# Patient Record
Sex: Male | Born: 1970 | Race: White | Hispanic: No | Marital: Married | Smoking: Former smoker
Health system: Southern US, Community
[De-identification: ages and names within clinical notes are randomized; demographics above are authoritative.]

## PROBLEM LIST (undated history)

## (undated) DIAGNOSIS — L400 Psoriasis vulgaris: Secondary | ICD-10-CM

## (undated) DIAGNOSIS — R002 Palpitations: Secondary | ICD-10-CM

## (undated) DIAGNOSIS — F411 Generalized anxiety disorder: Secondary | ICD-10-CM

## (undated) DIAGNOSIS — M1990 Unspecified osteoarthritis, unspecified site: Secondary | ICD-10-CM

## (undated) DIAGNOSIS — K219 Gastro-esophageal reflux disease without esophagitis: Secondary | ICD-10-CM

## (undated) DIAGNOSIS — K5190 Ulcerative colitis, unspecified, without complications: Secondary | ICD-10-CM

## (undated) DIAGNOSIS — I472 Ventricular tachycardia: Secondary | ICD-10-CM

## (undated) HISTORY — DX: Palpitations: R00.2

## (undated) HISTORY — DX: Psoriasis vulgaris: L40.0

## (undated) HISTORY — DX: Ulcerative colitis, unspecified, without complications: K51.90

## (undated) HISTORY — DX: Unspecified osteoarthritis, unspecified site: M19.90

## (undated) HISTORY — DX: Gastro-esophageal reflux disease without esophagitis: K21.9

## (undated) HISTORY — DX: Ventricular tachycardia: I47.2

## (undated) HISTORY — DX: Generalized anxiety disorder: F41.1

---

## 2011-03-26 ENCOUNTER — Ambulatory Visit (HOSPITAL_COMMUNITY)
Admission: RE | Admit: 2011-03-26 | Discharge: 2011-03-26 | Disposition: A | Discharge status: Home / Self Care | Payer: BC Managed Care – PPO | Source: Ambulatory Visit | Attending: Gastroenterology | Admitting: Gastroenterology

## 2011-03-27 ENCOUNTER — Ambulatory Visit (HOSPITAL_COMMUNITY)
Admission: RE | Admit: 2011-03-27 | Discharge: 2011-03-27 | Disposition: A | Discharge status: Home / Self Care | Payer: BC Managed Care – PPO | Source: Ambulatory Visit | Attending: Gastroenterology | Admitting: Gastroenterology

## 2011-03-27 ENCOUNTER — Other Ambulatory Visit: Payer: Self-pay | Admitting: Gastroenterology

## 2011-03-27 DIAGNOSIS — K2940 Chronic atrophic gastritis without bleeding: Secondary | ICD-10-CM | POA: Insufficient documentation

## 2011-03-27 DIAGNOSIS — K3189 Other diseases of stomach and duodenum: Secondary | ICD-10-CM | POA: Insufficient documentation

## 2011-03-27 DIAGNOSIS — K208 Other esophagitis without bleeding: Secondary | ICD-10-CM | POA: Insufficient documentation

## 2011-03-27 DIAGNOSIS — R1013 Epigastric pain: Secondary | ICD-10-CM | POA: Insufficient documentation

## 2012-05-01 ENCOUNTER — Inpatient Hospital Stay: Admit: 2012-05-01 | Payer: Self-pay | Admitting: Orthopedic Surgery

## 2012-05-01 ENCOUNTER — Encounter (HOSPITAL_COMMUNITY): Payer: Self-pay | Admitting: Anesthesiology

## 2012-05-01 ENCOUNTER — Emergency Department (HOSPITAL_COMMUNITY): Payer: BC Managed Care – PPO | Admitting: Anesthesiology

## 2012-05-01 ENCOUNTER — Observation Stay (HOSPITAL_COMMUNITY)
Admission: EM | Admit: 2012-05-01 | Discharge: 2012-05-04 | Disposition: A | Discharge status: Home / Self Care | Payer: BC Managed Care – PPO | Attending: Emergency Medicine | Admitting: Emergency Medicine

## 2012-05-01 ENCOUNTER — Encounter (HOSPITAL_COMMUNITY): Payer: Self-pay | Admitting: *Deleted

## 2012-05-01 ENCOUNTER — Encounter (HOSPITAL_COMMUNITY)
Admission: EM | Disposition: A | Discharge status: Home / Self Care | Payer: Self-pay | Source: Home / Self Care | Attending: Orthopedic Surgery

## 2012-05-01 DIAGNOSIS — S61209A Unspecified open wound of unspecified finger without damage to nail, initial encounter: Principal | ICD-10-CM | POA: Insufficient documentation

## 2012-05-01 DIAGNOSIS — Y939 Activity, unspecified: Secondary | ICD-10-CM | POA: Insufficient documentation

## 2012-05-01 DIAGNOSIS — S61409A Unspecified open wound of unspecified hand, initial encounter: Secondary | ICD-10-CM

## 2012-05-01 DIAGNOSIS — IMO0002 Reserved for concepts with insufficient information to code with codable children: Secondary | ICD-10-CM | POA: Insufficient documentation

## 2012-05-01 DIAGNOSIS — Y929 Unspecified place or not applicable: Secondary | ICD-10-CM | POA: Insufficient documentation

## 2012-05-01 DIAGNOSIS — S65509A Unspecified injury of blood vessel of unspecified finger, initial encounter: Secondary | ICD-10-CM | POA: Insufficient documentation

## 2012-05-01 DIAGNOSIS — W278XXA Contact with other nonpowered hand tool, initial encounter: Secondary | ICD-10-CM | POA: Insufficient documentation

## 2012-05-01 DIAGNOSIS — S5420XA Injury of radial nerve at forearm level, unspecified arm, initial encounter: Secondary | ICD-10-CM | POA: Insufficient documentation

## 2012-05-01 HISTORY — PX: I & D EXTREMITY: SHX5045

## 2012-05-01 HISTORY — PX: NERVE AND TENDON REPAIR: SHX5693

## 2012-05-01 LAB — CBC WITH DIFFERENTIAL/PLATELET
Basophils Absolute: 0 10*3/uL (ref 0.0–0.1)
Basophils Relative: 0 % (ref 0–1)
Eosinophils Absolute: 0 10*3/uL (ref 0.0–0.7)
Hemoglobin: 16.5 g/dL (ref 13.0–17.0)
MCH: 29.8 pg (ref 26.0–34.0)
MCHC: 34.7 g/dL (ref 30.0–36.0)
Monocytes Relative: 2 % — ABNORMAL LOW (ref 3–12)
Neutro Abs: 12 10*3/uL — ABNORMAL HIGH (ref 1.7–7.7)
Neutrophils Relative %: 86 % — ABNORMAL HIGH (ref 43–77)
Platelets: 326 10*3/uL (ref 150–400)
RDW: 13.4 % (ref 11.5–15.5)

## 2012-05-01 LAB — POCT I-STAT, CHEM 8
Chloride: 106 mEq/L (ref 96–112)
Glucose, Bld: 104 mg/dL — ABNORMAL HIGH (ref 70–99)
HCT: 51 % (ref 39.0–52.0)
Hemoglobin: 17.3 g/dL — ABNORMAL HIGH (ref 13.0–17.0)
Potassium: 4.1 mEq/L (ref 3.5–5.1)
Sodium: 143 mEq/L (ref 135–145)

## 2012-05-01 SURGERY — IRRIGATION AND DEBRIDEMENT EXTREMITY
Anesthesia: General | Site: Hand | Laterality: Left | Wound class: Dirty or Infected

## 2012-05-01 MED ORDER — LIDOCAINE HCL 4 % MT SOLN
OROMUCOSAL | Status: DC | PRN
  Administered 2012-05-01: 4 mL via TOPICAL

## 2012-05-01 MED ORDER — SODIUM CHLORIDE 0.9 % IR SOLN
Status: DC | PRN
  Administered 2012-05-01: 6000 mL

## 2012-05-01 MED ORDER — LACTATED RINGERS IV SOLN
INTRAVENOUS | Status: DC | PRN
  Administered 2012-05-01 (×3): via INTRAVENOUS

## 2012-05-01 MED ORDER — MIDAZOLAM HCL 5 MG/5ML IJ SOLN
INTRAMUSCULAR | Status: DC | PRN
  Administered 2012-05-01: 2 mg via INTRAVENOUS

## 2012-05-01 MED ORDER — LIDOCAINE HCL (CARDIAC) 20 MG/ML IV SOLN
INTRAVENOUS | Status: DC | PRN
  Administered 2012-05-01: 100 mg via INTRAVENOUS

## 2012-05-01 MED ORDER — ROCURONIUM BROMIDE 100 MG/10ML IV SOLN
INTRAVENOUS | Status: DC | PRN
  Administered 2012-05-01 (×5): 10 mg via INTRAVENOUS
  Administered 2012-05-01: 50 mg via INTRAVENOUS
  Administered 2012-05-01: 10 mg via INTRAVENOUS

## 2012-05-01 MED ORDER — DEXTROSE 5 % IV SOLN
INTRAVENOUS | Status: DC | PRN
  Administered 2012-05-01: 20:00:00 via INTRAVENOUS

## 2012-05-01 MED ORDER — PROPOFOL 10 MG/ML IV BOLUS
INTRAVENOUS | Status: DC | PRN
  Administered 2012-05-01: 200 mg via INTRAVENOUS

## 2012-05-01 MED ORDER — CEFAZOLIN SODIUM-DEXTROSE 2-3 GM-% IV SOLR
INTRAVENOUS | Status: DC | PRN
  Administered 2012-05-01: 2 g via INTRAVENOUS

## 2012-05-01 MED ORDER — NEOSTIGMINE METHYLSULFATE 1 MG/ML IJ SOLN
INTRAMUSCULAR | Status: DC | PRN
  Administered 2012-05-01: 4 mg via INTRAVENOUS

## 2012-05-01 MED ORDER — HEPARIN SODIUM (PORCINE) 1000 UNIT/ML IJ SOLN
INTRAMUSCULAR | Status: DC | PRN
  Administered 2012-05-01: 1000 [IU] via INTRAVENOUS

## 2012-05-01 MED ORDER — SUCCINYLCHOLINE CHLORIDE 20 MG/ML IJ SOLN
INTRAMUSCULAR | Status: DC | PRN
  Administered 2012-05-01: 120 mg via INTRAVENOUS

## 2012-05-01 MED ORDER — GLYCOPYRROLATE 0.2 MG/ML IJ SOLN
INTRAMUSCULAR | Status: DC | PRN
  Administered 2012-05-01: .6 mg via INTRAVENOUS

## 2012-05-01 MED ORDER — ONDANSETRON HCL 4 MG/2ML IJ SOLN
INTRAMUSCULAR | Status: DC | PRN
  Administered 2012-05-01: 4 mg via INTRAVENOUS

## 2012-05-01 MED ORDER — DEXAMETHASONE SODIUM PHOSPHATE 4 MG/ML IJ SOLN
INTRAMUSCULAR | Status: DC | PRN
  Administered 2012-05-01: 8 mg via INTRAVENOUS

## 2012-05-01 MED ORDER — FENTANYL CITRATE 0.05 MG/ML IJ SOLN
INTRAMUSCULAR | Status: DC | PRN
  Administered 2012-05-01 (×2): 50 ug via INTRAVENOUS
  Administered 2012-05-01 (×2): 100 ug via INTRAVENOUS
  Administered 2012-05-01 (×2): 50 ug via INTRAVENOUS
  Administered 2012-05-01: 100 ug via INTRAVENOUS

## 2012-05-01 SURGICAL SUPPLY — 66 items
BANDAGE CONFORM 2  STR LF (GAUZE/BANDAGES/DRESSINGS) IMPLANT
BANDAGE ELASTIC 4 VELCRO ST LF (GAUZE/BANDAGES/DRESSINGS) ×4 IMPLANT
BANDAGE GAUZE ELAST BULKY 4 IN (GAUZE/BANDAGES/DRESSINGS) ×2 IMPLANT
CLOTH BEACON ORANGE TIMEOUT ST (SAFETY) ×2 IMPLANT
CONT SPEC 4OZ CLIKSEAL STRL BL (MISCELLANEOUS) ×2 IMPLANT
CORDS BIPOLAR (ELECTRODE) ×2 IMPLANT
CUFF TOURNIQUET SINGLE 18IN (TOURNIQUET CUFF) IMPLANT
CUFF TOURNIQUET SINGLE 24IN (TOURNIQUET CUFF) ×2 IMPLANT
CUFF TOURNIQUET SINGLE 34IN LL (TOURNIQUET CUFF) IMPLANT
CUFF TOURNIQUET SINGLE 44IN (TOURNIQUET CUFF) IMPLANT
DRSG ADAPTIC 3X8 NADH LF (GAUZE/BANDAGES/DRESSINGS) IMPLANT
DRSG EMULSION OIL 3X3 NADH (GAUZE/BANDAGES/DRESSINGS) ×2 IMPLANT
ELECT REM PT RETURN 9FT ADLT (ELECTROSURGICAL)
ELECTRODE REM PT RTRN 9FT ADLT (ELECTROSURGICAL) IMPLANT
GAUZE SPONGE 4X4 16PLY XRAY LF (GAUZE/BANDAGES/DRESSINGS) ×4 IMPLANT
GAUZE XEROFORM 1X8 LF (GAUZE/BANDAGES/DRESSINGS) IMPLANT
GAUZE XEROFORM 5X9 LF (GAUZE/BANDAGES/DRESSINGS) ×2 IMPLANT
GLOVE BIO SURGEON STRL SZ7.5 (GLOVE) ×2 IMPLANT
GLOVE BIOGEL M STRL SZ7.5 (GLOVE) IMPLANT
GLOVE BIOGEL PI IND STRL 7.5 (GLOVE) ×2 IMPLANT
GLOVE BIOGEL PI INDICATOR 7.5 (GLOVE) ×2
GLOVE SS BIOGEL STRL SZ 8 (GLOVE) ×2 IMPLANT
GLOVE SS BIOGEL STRL SZ 8.5 (GLOVE) ×1 IMPLANT
GLOVE SS BIOGEL STRL SZ 9 (GLOVE) ×3 IMPLANT
GLOVE SUPERSENSE BIOGEL SZ 8 (GLOVE) ×2
GLOVE SUPERSENSE BIOGEL SZ 8.5 (GLOVE) ×1
GLOVE SUPERSENSE BIOGEL SZ 9 (GLOVE) ×3
GOWN PREVENTION PLUS XLARGE (GOWN DISPOSABLE) ×2 IMPLANT
GOWN STRL NON-REIN LRG LVL3 (GOWN DISPOSABLE) ×2 IMPLANT
GOWN STRL REIN XL XLG (GOWN DISPOSABLE) ×2 IMPLANT
GUIDE NERVE NEURAGEN 1.5MM (Tissue) ×2 IMPLANT
GUIDE NRV 3X7RSRB NEURAGEN 2M (Tissue) ×1 IMPLANT
HANDPIECE INTERPULSE COAX TIP (DISPOSABLE)
IV NS IRRIG 3000ML ARTHROMATIC (IV SOLUTION) ×6 IMPLANT
KIT BASIN OR (CUSTOM PROCEDURE TRAY) ×2 IMPLANT
KIT ROOM TURNOVER OR (KITS) ×2 IMPLANT
MANIFOLD NEPTUNE II (INSTRUMENTS) ×2 IMPLANT
NEEDLE HYPO 25GX1X1/2 BEV (NEEDLE) IMPLANT
NERVE GUIDE NEURAGEN 2MM (Tissue) ×1 IMPLANT
NS IRRIG 1000ML POUR BTL (IV SOLUTION) ×6 IMPLANT
PACK ORTHO EXTREMITY (CUSTOM PROCEDURE TRAY) ×2 IMPLANT
PAD ARMBOARD 7.5X6 YLW CONV (MISCELLANEOUS) ×4 IMPLANT
PAD CAST 4YDX4 CTTN HI CHSV (CAST SUPPLIES) IMPLANT
PADDING CAST ABS 4INX4YD NS (CAST SUPPLIES) ×1
PADDING CAST ABS COTTON 4X4 ST (CAST SUPPLIES) ×1 IMPLANT
PADDING CAST COTTON 4X4 STRL (CAST SUPPLIES)
SET HNDPC FAN SPRY TIP SCT (DISPOSABLE) IMPLANT
SPEAR EYE SURG WECK-CEL (MISCELLANEOUS) ×2 IMPLANT
SPLINT FIBERGLASS 4X30 (CAST SUPPLIES) ×2 IMPLANT
SPONGE GAUZE 4X4 12PLY (GAUZE/BANDAGES/DRESSINGS) ×2 IMPLANT
SPONGE LAP 18X18 X RAY DECT (DISPOSABLE) ×2 IMPLANT
SPONGE LAP 4X18 X RAY DECT (DISPOSABLE) ×2 IMPLANT
SUT ETHILON 10 0 BV75 4 (SUTURE) ×4 IMPLANT
SUT ETHILON 8 0 BV130 4 (SUTURE) ×6 IMPLANT
SUT ETHILON 9 0 BV130 4 (SUTURE) ×2 IMPLANT
SUT FIBERWIRE 4-0 18 DIAM BLUE (SUTURE) ×8
SUT PROLENE 4 0 PS 2 18 (SUTURE) ×18 IMPLANT
SUTURE FIBERWR 4-0 18 DIA BLUE (SUTURE) ×4 IMPLANT
SYR CONTROL 10ML LL (SYRINGE) IMPLANT
SYRINGE 10CC LL (SYRINGE) ×2 IMPLANT
TOWEL OR 17X24 6PK STRL BLUE (TOWEL DISPOSABLE) ×2 IMPLANT
TOWEL OR 17X26 10 PK STRL BLUE (TOWEL DISPOSABLE) ×2 IMPLANT
TUBE ANAEROBIC SPECIMEN COL (MISCELLANEOUS) IMPLANT
TUBE CONNECTING 12X1/4 (SUCTIONS) ×2 IMPLANT
WATER STERILE IRR 1000ML POUR (IV SOLUTION) IMPLANT
YANKAUER SUCT BULB TIP NO VENT (SUCTIONS) ×2 IMPLANT

## 2012-05-01 NOTE — Anesthesia Procedure Notes (Signed)
Procedure Name: Intubation Date/Time: 05/01/2012 7:33 PM Performed by: Emaan Gary S Pre-anesthesia Checklist: Patient identified, Timeout performed, Emergency Drugs available, Suction available and Patient being monitored Patient Re-evaluated:Patient Re-evaluated prior to inductionOxygen Delivery Method: Circle system utilized Preoxygenation: Pre-oxygenation with 100% oxygen Intubation Type: IV induction and Rapid sequence Ventilation: Mask ventilation without difficulty Grade View: Grade I Tube type: Oral Tube size: 7.5 mm Number of attempts: 1 Airway Equipment and Method: Stylet Placement Confirmation: ETT inserted through vocal cords under direct vision,  positive ETCO2 and breath sounds checked- equal and bilateral Secured at: 22 cm Tube secured with: Tape Dental Injury: Teeth and Oropharynx as per pre-operative assessment

## 2012-05-01 NOTE — ED Provider Notes (Signed)
Patient placed in CDU as a transfer from [facility]. Patient is here for left hand laceration and has received Keflex by mouth, Percocet for pain and tetanus booster IM at [facility] prior to transfer.   Patient states he was working with a skill saw when the plate dropped and he attempted to catch it with his hand. He states that while being evaluated at [facility] an x-ray was taken of his hand showing involvement of the bone on the left ring finger and the physician was concerned about severance of the flexor tendon. Plan per previous provider is to consult Dr. Amanda Pea for surgical repair.  Patient re-evaluated and is resting comfortably, VSS, with no new complaints or concerns at this time.  On exam: hemodynamically stable, NAD, heart w/ RRR, lungs CTAB, Chest & abd non-tender, no peripheral edema or calf tenderness, large laceration across all 4 fingers of the left hand, capillary refill less than 3 seconds, inability to move the pointer and ring fingers of the left hand.  Patient pain controled at this time.  BP 154/89  Pulse 90  Temp 98.2 F (36.8 C) (Oral)  Resp 20  SpO2 100%  Discussed with patient current lab and imaging results as well as their care plan, patient questions answered.  Patient is amenable to the plan.   6:13 PM Dr Amanda Pea has evaluated the patient and will be transferring to the OR for repair.    Dahlia Client Landan Fedie, PA-C 05/01/12 1813

## 2012-05-01 NOTE — ED Notes (Signed)
Pt in c/o laceration to left hand, lacerations to all four fingers. Pt was told to come here to meet hand surgeon in ED, pt from [facility].

## 2012-05-01 NOTE — Preoperative (Signed)
Beta Blockers   Reason not to administer Beta Blockers:Not Applicable 

## 2012-05-01 NOTE — ED Provider Notes (Signed)
MSE was initiated and I personally evaluated the patient and placed orders (if any) at  4:59 PM on May 01, 2012.  The patient appears stable so that the remainder of the MSE may be completed by another provider.  Pt received treatment at another ED.  He is here to meet the orthopedic hand surgeon, Dr Amanda Pea.  Pt is not in any pain now.  He was given a tetanus shot.  He has his records and xrays with him.  Celene Kras, MD 05/01/12 [phone]

## 2012-05-01 NOTE — Anesthesia Preprocedure Evaluation (Addendum)
Anesthesia Evaluation  Patient identified by MRN, date of birth, ID band Patient awake    Reviewed: Allergy & Precautions, H&P , NPO status , Patient's Chart, lab work & pertinent test results, reviewed documented beta blocker date and time   Airway Mallampati: II TM Distance: >3 FB Neck ROM: full    Dental  (+) Teeth Intact and Dental Advisory Given   Pulmonary neg pulmonary ROS,  breath sounds clear to auscultation        Cardiovascular negative cardio ROS  Rhythm:regular     Neuro/Psych negative neurological ROS  negative psych ROS   GI/Hepatic negative GI ROS, Neg liver ROS,   Endo/Other  negative endocrine ROS  Renal/GU negative Renal ROS  negative genitourinary   Musculoskeletal   Abdominal   Peds  Hematology negative hematology ROS (+)   Anesthesia Other Findings See surgeon's H&P   Reproductive/Obstetrics negative OB ROS                          Anesthesia Physical Anesthesia Plan  ASA: II and emergent  Anesthesia Plan: General   Post-op Pain Management:    Induction: Intravenous, Rapid sequence and Cricoid pressure planned  Airway Management Planned: Oral ETT  Additional Equipment:   Intra-op Plan:   Post-operative Plan: Extubation in OR  Informed Consent: I have reviewed the patients History and Physical, chart, labs and discussed the procedure including the risks, benefits and alternatives for the proposed anesthesia with the patient or authorized representative who has indicated his/her understanding and acceptance.   Dental Advisory Given and Dental advisory given  Plan Discussed with: CRNA and Surgeon  Anesthesia Plan Comments:        Anesthesia Quick Evaluation

## 2012-05-01 NOTE — H&P (Signed)
[name] is an 41 y.o. male.   Chief Complaint: Saw injury to the left hand  HPI: Patient presents for evaluation and surgical treatment after a saw injury to his left hand. He has index middle ring and small finger lacerations secondary to the saw. The left hand is the area involved. He was seen outside [facility] and referred him from [place]. I accepted the patient's care. He is been given a tetanus shot and Keflex by mouth. At the time of consultation he denies neck back chest or abdominal pain. He notes no locking popping or catching. He states he had the saw in the right hand and saw across his left hand in a reflex reaction as something was falling  He understands the nature of his predicament he is here today with his wife I've counseled him at length. The patient notes pain and the feeling of a searing nerve type jolt to his hand as soon as the accident occurred. He states this was a fairly new blade in terms of the saw.  History reviewed. No pertinent past medical history.  History reviewed. No pertinent past surgical history.  History reviewed. No pertinent family history. Social History:  does not have a smoking history on file. He does not have any smokeless tobacco history on file. His alcohol and drug histories not on file.  Allergies: No Known Allergies  No prescriptions prior to admission    Results for orders placed during the hospital encounter of 05/01/12 (from the past 48 hour(s))  CBC WITH DIFFERENTIAL     Status: Abnormal   Collection Time   05/01/12  6:10 PM      Component Value Range Comment   WBC 14.0 (*) 4.0 - 10.5 K/uL    RBC 5.54  4.22 - 5.81 MIL/uL    Hemoglobin 16.5  13.0 - 17.0 g/dL    HCT 16.1  09.6 - 04.5 %    MCV 85.7  78.0 - 100.0 fL    MCH 29.8  26.0 - 34.0 pg    MCHC 34.7  30.0 - 36.0 g/dL    RDW 40.9  81.1 - 91.4 %    Platelets 326  150 - 400 K/uL    Neutrophils Relative 86 (*) 43 - 77 %    Neutro Abs 12.0 (*) 1.7 - 7.7 K/uL    Lymphocytes Relative 12  12 - 46 %    Lymphs Abs 1.7  0.7 - 4.0 K/uL    Monocytes Relative 2 (*) 3 - 12 %    Monocytes Absolute 0.3  0.1 - 1.0 K/uL    Eosinophils Relative 0  0 - 5 %    Eosinophils Absolute 0.0  0.0 - 0.7 K/uL    Basophils Relative 0  0 - 1 %    Basophils Absolute 0.0  0.0 - 0.1 K/uL   POCT I-STAT, CHEM 8     Status: Abnormal   Collection Time   05/01/12  6:36 PM      Component Value Range Comment   Sodium 143  135 - 145 mEq/L    Potassium 4.1  3.5 - 5.1 mEq/L    Chloride 106  96 - 112 mEq/L    BUN 16  6 - 23 mg/dL    Creatinine, Ser 7.82  0.50 - 1.35 mg/dL    Glucose, Bld 956 (*) 70 - 99 mg/dL    Calcium, Ion 2.13 (*) 1.12 - 1.23 mmol/L    TCO2 24  0 - 100 mmol/L    Hemoglobin 17.3 (*) 13.0 - 17.0 g/dL    HCT 40.9  81.1 - 91.4 %    No results found.  Review of Systems  Constitutional: Negative.   HENT: Negative.   Eyes: Negative.   Respiratory: Negative.   Cardiovascular: Negative.   Gastrointestinal: Negative.   Skin:       History of psoriasis with outbreak in his legs  Neurological: Negative.   Endo/Heme/Allergies: Negative.   Psychiatric/Behavioral: Negative.     Blood pressure 154/89, pulse 90, temperature 98.2 F (36.8 C), temperature source Oral, resp. rate 20, SpO2 100.00%. Physical Exam  Patient has lacerations secondary to table saw with mutilating type injury to the volar aspect of the index middle ring and small fingers. The index middle and ring had tendon involvement the middle finger has some degree of a cascade effect. The small finger has intact FDS. He does not have good refill to the index finger. The small finger ring finger middle finger and index finger difficult to evaluate in terms of sensory capabilities do to a previous block placed outside of our hospital system.  The patient has difficulty moving his fingers he is very apprehensive. The injuries unfortunately a very jagged and somewhat mutilating. I've casting in regards to  this.  He has psoriasis outbreak in his legs. HEENT is within normal limits. Abdomen is nontender protuberant and nondistended neck and back are nontender chest is equal expansion and he is non-O2 dependent. Assessment/Plan We have recommended surgery immediately. Will plan for irrigation and debridement and repair reconstruction is necessary. I discussed him risk and benefits including loss of digit pain stiffness adhesions complex regional pain syndrome and other risks are main his injury. We're going to do our best to try to salvage his hand but certainly he has a major problem and will have some degree of disability permanently in my estimation. In the best case scenario we have to give him a hand that is functional alive and albeit with some decreased sensation. I discussed him that this is a very unfortunate and life changing predicament. I cannot proceed into the future in terms of his viability and he understands this.  Marland Kitchen.We are planning surgery for your upper extremity. The risk and benefits of surgery include risk of bleeding infection anesthesia damage to normal structures and failure of the surgery to accomplish its intended goals of relieving symptoms and restoring function with this in mind we'll going to proceed. I have specifically discussed with the patient the pre-and postoperative regime and the does and don'ts and risk and benefits in great detail. Risk and benefits of surgery also include risk of dystrophy chronic nerve pain failure of the healing process to go onto completion and other inherent risks of surgery The relavent the pathophysiology of the disease/injury process, as well as the alternatives for treatment and postoperative course of action has been discussed in great detail with the patient who desires to proceed.  We will do everything in our power to help you (the patient) restore function to the upper extremity. Is a pleasure to see this patient today.   Karen Chafe 05/01/2012, 11:59 PM

## 2012-05-02 LAB — CBC WITH DIFFERENTIAL/PLATELET
Lymphocytes Relative: 8 % — ABNORMAL LOW (ref 12–46)
Lymphs Abs: 0.7 10*3/uL (ref 0.7–4.0)
MCV: 86.7 fL (ref 78.0–100.0)
Neutrophils Relative %: 92 % — ABNORMAL HIGH (ref 43–77)
Platelets: 250 10*3/uL (ref 150–400)
RBC: 4.8 MIL/uL (ref 4.22–5.81)
WBC: 9.3 10*3/uL (ref 4.0–10.5)

## 2012-05-02 LAB — BASIC METABOLIC PANEL
CO2: 23 mEq/L (ref 19–32)
Calcium: 8.9 mg/dL (ref 8.4–10.5)
Potassium: 4.1 mEq/L (ref 3.5–5.1)
Sodium: 138 mEq/L (ref 135–145)

## 2012-05-02 MED ORDER — METHOCARBAMOL 100 MG/ML IJ SOLN
500.0000 mg | Freq: Four times a day (QID) | INTRAVENOUS | Status: DC | PRN
  Filled 2012-05-02: qty 5

## 2012-05-02 MED ORDER — CEFAZOLIN SODIUM 1-5 GM-% IV SOLN
1.0000 g | Freq: Three times a day (TID) | INTRAVENOUS | Status: DC
  Administered 2012-05-02 – 2012-05-04 (×7): 1 g via INTRAVENOUS
  Filled 2012-05-02 (×9): qty 50

## 2012-05-02 MED ORDER — HYDROMORPHONE HCL PF 1 MG/ML IJ SOLN: 0.2500 mg | INTRAMUSCULAR | Status: DC | PRN

## 2012-05-02 MED ORDER — MORPHINE SULFATE 2 MG/ML IJ SOLN
1.0000 mg | INTRAMUSCULAR | Status: DC | PRN
  Administered 2012-05-02 – 2012-05-03 (×5): 1 mg via INTRAVENOUS
  Filled 2012-05-02 (×4): qty 1

## 2012-05-02 MED ORDER — DOCUSATE SODIUM 100 MG PO CAPS
100.0000 mg | ORAL_CAPSULE | Freq: Two times a day (BID) | ORAL | Status: DC
  Administered 2012-05-02 – 2012-05-04 (×5): 100 mg via ORAL
  Filled 2012-05-02 (×6): qty 1

## 2012-05-02 MED ORDER — METOCLOPRAMIDE HCL 5 MG/ML IJ SOLN
10.0000 mg | Freq: Once | INTRAMUSCULAR | Status: AC | PRN
  Administered 2012-05-02: 10 mg via INTRAVENOUS

## 2012-05-02 MED ORDER — DIPHENHYDRAMINE HCL 25 MG PO CAPS: 25.0000 mg | ORAL_CAPSULE | Freq: Four times a day (QID) | ORAL | Status: DC | PRN

## 2012-05-02 MED ORDER — ALPRAZOLAM 0.5 MG PO TABS
0.5000 mg | ORAL_TABLET | Freq: Four times a day (QID) | ORAL | Status: DC | PRN
  Administered 2012-05-02 – 2012-05-03 (×4): 0.5 mg via ORAL
  Filled 2012-05-02 (×4): qty 1

## 2012-05-02 MED ORDER — OXYCODONE HCL 5 MG PO TABS: 5.0000 mg | ORAL_TABLET | Freq: Once | ORAL | Status: DC | PRN

## 2012-05-02 MED ORDER — DEXTRAN 40 IN D5W 10 % IV SOLN
8.3000 mL/h | INTRAVENOUS | Status: AC
  Administered 2012-05-02: 8 mL/h via INTRAVENOUS
  Filled 2012-05-02: qty 500

## 2012-05-02 MED ORDER — LACTATED RINGERS IV SOLN
INTRAVENOUS | Status: DC
  Administered 2012-05-03: 03:00:00 via INTRAVENOUS

## 2012-05-02 MED ORDER — METHOCARBAMOL 500 MG PO TABS
500.0000 mg | ORAL_TABLET | Freq: Four times a day (QID) | ORAL | Status: DC | PRN
  Administered 2012-05-02 – 2012-05-04 (×6): 500 mg via ORAL
  Filled 2012-05-02 (×5): qty 1

## 2012-05-02 MED ORDER — VITAMIN C 500 MG PO TABS
1000.0000 mg | ORAL_TABLET | Freq: Every day | ORAL | Status: DC
  Administered 2012-05-02 – 2012-05-04 (×3): 1000 mg via ORAL
  Filled 2012-05-02 (×3): qty 2

## 2012-05-02 MED ORDER — OXYCODONE HCL 5 MG PO TABS
5.0000 mg | ORAL_TABLET | ORAL | Status: DC | PRN
  Administered 2012-05-02: 10 mg via ORAL
  Administered 2012-05-02: 5 mg via ORAL
  Administered 2012-05-03 (×3): 10 mg via ORAL
  Administered 2012-05-03: 5 mg via ORAL
  Administered 2012-05-03: 10 mg via ORAL
  Administered 2012-05-04 (×2): 5 mg via ORAL
  Filled 2012-05-02 (×2): qty 2
  Filled 2012-05-02 (×3): qty 1
  Filled 2012-05-02 (×3): qty 2
  Filled 2012-05-02: qty 1

## 2012-05-02 MED ORDER — ONDANSETRON HCL 4 MG/2ML IJ SOLN: 4.0000 mg | Freq: Four times a day (QID) | INTRAMUSCULAR | Status: DC | PRN

## 2012-05-02 MED ORDER — ASPIRIN 325 MG PO TABS
325.0000 mg | ORAL_TABLET | Freq: Two times a day (BID) | ORAL | Status: DC
  Administered 2012-05-02 – 2012-05-04 (×5): 325 mg via ORAL
  Filled 2012-05-02 (×7): qty 1

## 2012-05-02 MED ORDER — ONDANSETRON HCL 4 MG PO TABS: 4.0000 mg | ORAL_TABLET | Freq: Four times a day (QID) | ORAL | Status: DC | PRN

## 2012-05-02 MED ORDER — PROMETHAZINE HCL 25 MG RE SUPP: 12.5000 mg | Freq: Four times a day (QID) | RECTAL | Status: DC | PRN

## 2012-05-02 MED ORDER — OXYCODONE HCL 5 MG/5ML PO SOLN: 5.0000 mg | Freq: Once | ORAL | Status: DC | PRN

## 2012-05-02 MED ORDER — FAMOTIDINE 20 MG PO TABS: 20.0000 mg | ORAL_TABLET | Freq: Two times a day (BID) | ORAL | Status: DC | PRN

## 2012-05-02 MED ORDER — CEFAZOLIN SODIUM 1-5 GM-% IV SOLN
1.0000 g | INTRAVENOUS | Status: AC
  Administered 2012-05-02: 1 g via INTRAVENOUS

## 2012-05-02 NOTE — Transfer of Care (Signed)
Immediate Anesthesia Transfer of Care Note  Patient: [name]  Procedure(s) Performed: Procedure(s) (LRB) with comments: IRRIGATION AND DEBRIDEMENT EXTREMITY (Left) NERVE AND TENDON REPAIR (Left)  Patient Location: PACU  Anesthesia Type:General  Level of Consciousness: awake, alert  and oriented  Airway & Oxygen Therapy: Patient Spontanous Breathing and Patient connected to nasal cannula oxygen  Post-op Assessment: Report given to PACU RN and Post -op Vital signs reviewed and stable  Post vital signs: Reviewed and stable  Complications: No apparent anesthesia complications

## 2012-05-02 NOTE — Progress Notes (Signed)
Patient ID: [name], male   DOB: 09/29/1970, 41 y.o.   MRN: [id number] Patient is seen at bedside. His mother is with him. He is alert awake and oriented. He notes no significant problems other than pain in his left hand which is expected. He and I had long discussion today about all issues.  He is tolerating a regular diet he is voiding well his vital signs are stable I should note his room temperature is a bit cool in my estimation and I would certainly recommend that we increased room temperature as my orders have reflected. We initially order postoperatively for room temperature to be 75.  The patient has a intact HEENT exam he has dextran running intravenously. He has intact refill to the index middle ring and small finger. There is no evidence of vascular compromise at present. I discussed with him the nature of his surgery.  Went over the fact that his exam is stable he is comfortable he is able to ambulate void and has excellent refill to the fingers. I would recommend we increased room temperature and he lies a heat pad. We're going continue aspirin and dextran. I discussed him the nature of his tendon nerve and artery repairs and the fact that we have a long road him from this in terms of rehabilitation in the best case scenario a finger than 40 minutes with them today discussing the do's and don'ts and aftermath involved in a saw injury with mutilating component.  The patient seems to have a good understanding of my discussion/description of the procedure and plans for the future.  We'll continue IV antibiotics pain management and full vascular precautions  All questions have been encouraged and answered. Due to notes a been discussed

## 2012-05-02 NOTE — Op Note (Signed)
NAMELogan, [name]                   ACCOUNT NO.:  [id number]  MEDICAL RECORD NO.:  [id number]  LOCATION:  [facility]                         FACILITY:  [facility]  PHYSICIAN:  Dionne Ano. Eduard Penkala, M.D.DATE OF BIRTH:  11-Aug-1970  DATE OF PROCEDURE:  05/02/2012 DATE OF DISCHARGE:                              OPERATIVE REPORT   PREOPERATIVE DIAGNOSIS:  Status post saw injury to the left hand with the index, middle, ring, and small finger involvement.  The patient has neurovascular injury as well as tendinous injuries extensive in nature.  POSTOPERATIVE DIAGNOSES: 1. Status post saw injury to the left hand with the index, middle,     ring, and small finger involvement.  The patient has neurovascular     injury as well as tendinous injuries extensive in nature with     injuries as described below surgical procedure irrigation and     debridement of skin, subcutaneous tissue, and bone in the form of     an excisional debridement, index finger, and ring finger. 2. Irrigation and debridement of skin, subcutaneous tissue, tendon,     and periosteal tissue, small finger and middle finger.  This was an     excisional debridement in nature. 3. Open treatment of bony fractures about the index finger. 4. Open treatment of bony fracture, ring finger.  This was a proximal     and middle phalanx injury. 5. Left small finger, ulnar digital nerve repair with NeuraGen     NeuraWrap. 6. Exploration of small finger flexor tendon apparatus with tenolysis     of the FDP and noted flexor sheath laceration. 7. Left ring finger, flexor digitorum profundus repair zone 2. 8. Left ring finger, radial digital nerve repair. 9. Left ring finger ulnar digital nerve repair. 10.Left ring finger, volar plate reconstruction. 11.Left ring finger, flexor digitorum superficialis repair. 12.Left middle finger flexor digitorum profundus repair. 13.Left middle finger radial digital nerve repair. 14.Left middle finger ulnar digital  nerve repair. 15.Left index finger flexor digitorum superficialis repair. 16.Left index finger flexor digitorum profundus repair. 17.Left index finger ulnar digital nerve repair. 18.Left index finger radial digital nerve repair. 19.Left index finger radial digital artery repair.  INSTRUMENT:  Microscope use for nerve vascular reconstructions about the index through small fingers.  SURGEON:  Oletta Cohn, MD.  ASSISTANT:  None.  COMPLICATIONS:  None.  ANESTHESIA:  General.  INDICATIONS:  This patient is a 41 year old male who presents with the above-mentioned diagnoses.  I have counseled in regard to risks and benefits of surgery including risk of infection, bleeding, anesthesia, damage to normal structures, and failure of surgery to accomplish its intended goals of relieving symptoms and restoring function.  With this in mind, he desires to proceed.  All questions have been encouraged and answered preoperatively.  OPERATIVE PROCEDURE:  The patient was seen by myself and anesthesia. Taken to the operative suite, underwent smooth induction of anesthesia by Dr. Hart Robinsons.  Following this, a preop Ancef was given.  He is prepped and draped in the usual sterile fashion with Betadine scrub and paint.  Under my supervision.  Once this done, outline marks were made.  The tourniquet was  let down.  At this juncture, I performed an initial I and D of skin, subcutaneous tissue, about all fingers.  Once this done I then inflated the tourniquet and performed a more aggressive irrigation and debridement of skin, subcutaneous tissue, tendon and paratenon tissue, and bone tissue about the index finger and ring finger.  Following this, I performed I and D of skin, subcutaneous tissue, peritenon, and periosteal tissue, a small finger and middle finger, left hand.  These procedures were excisional debridements with scalpel curette knife blade and scissors.  Following this, I then  placed 6 L of saline through the areas involved to wash out the wounds aggressively.  These were 4 different I and D's with 4 separate surgical sites.  Following this, I then performed exploration of all the fingers and outlined the structures to be repaired.  I outlined the small finger, ring finger, middle finger, and index finger.  At this juncture, I performed open treatment of the index finger proximal phalanx fracture. Also performed open treatment of the ring finger, proximal middle phalanx fracture region.  He had an obvious volar plate injury about the ring fingers well.  Once, this was complete and a thorough exploration was performed, I then identified all the nerves and vascular structures to ensure that I was outlining the entirety of the injury and this was done with the tourniquet insufflated.  At this time, I deflated the tourniquet.  43 minutes and irrigated once again.  Following this I reinflated the tourniquet set out for repairs.  The left small finger underwent ulnar digital nerve repair with nerve wrapping utilizing and DuraGen 2.  Following this, I then performed a flexor tenolysis tenosynovectomy.  The flexor apparatus was intact, but the sheath was encroached upon about the small finger.  Following this, the ring finger was addressed with repair of the flexor digitorum superficialis with FiberWire followed by repair of the flexor digitorum profundus, with FiberWire suture.  I was able to get a 4- strand repair about both the profundus and superficialis tendons.  Prior to this the volar plate underwent reconstruction with a FiberWire stitch.  The FiberWire stitch was placed without difficulty, and the volar plate was reconstructed.  Following this, an informed radial digital nerve and radial digital problems RA and ulnar digital nerve repair.  Just the radial digital nerve, underwent coaptation with an 8-0 epineurial suture.  The ulnar digital nerve  underwent repair with 8-0 nylon in an epineural fashion.  I then placed a DuraGen tubes around the repair site.  Thus, 2 tendons, 2 nerves, a volar plate and associated soft tissues were repaired about the ring finger.  Following this I turned attention towards the middle finger.  The middle finger underwent repair of the radial digital nerve and ulnar digital nerve with microscopic use, utilizing 8-0 nylon I coapted the ends and performed placement of general NeuraGen about these areas.  Following this, the flexor digitorum profundus was repaired with a 4- strand technique given the caliber.  This was done without difficulty and the superficialis was intact.  Thus two nerve repairs and a flexor digitorum profundus in zone 2 were repaired about the middle finger. This was done under microscopic control.  In terms of the nerve repairs.  At this juncture, we assessed the fingers at the refill to the small ring and middle finger looked excellent.  The small finger had a good looking bundles, ring finger, middle finger has at least one digital artery intact.  The  most radial artery (radial digital artery) on the ring finger and the ulnar digital artery on the middle finger were viable it appeared.  The refill was excellent.  The fingers were warm.  At this time, I turned attention towards the index finger.  The patient, of course, had repair of the flexor digitorum profundus and repair of the flexor digitorum superficialis with a 4-strand modified Kessler technique stitch.  All tendons were repaired with modified Kessler technique utilizing 4-0 FiberWire.  Coaptation of the tendons went without difficulty.  Following this, the ulnar digital nerve was repaired.  The ulnar digital artery was not suitable for any reconstruction, but there was a branch off that was giving some dorsal flow.  I looked this thoroughly under the microscope, I repaired the digital nerve without difficulty  utilizing epineural technique. Following this, I then sat out for repair of the radial digital artery, this was the most suitable artery for repair and certainly with the finger looking dusky.  I felt that this had to be done well to salvage the finger.  At this time, I sharpened the ends, placed lidocaine to dilate the vessels and placed a small vessel dilator about the areas.  I was able achieve proximal and distal flow I then coapted the two ends with an Acklin clamp and then performed a repair with 10-0 nylon suture under the microscope.  The patient and excellent repair in a stow-type looking pulse to it.  I was very pleased with this and the finger immediately pinked up.  We were quite happy to see this.  Following this, I then repaired the radial digital nerve.  Thus, the radial digital nerve and ulnar digital nerve repair about the index finger, and a radial digital artery repair was accomplished.  Both the FDP and FDS tendons in zone 2 were repaired also.  I should note that the patient tolerated this well.  At this juncture the refill looked perfect.  We irrigated once again with warm saline.  Then I should note there was over 7-8 L of saline placed through the wounds for lavage purposes.  At this juncture, I very carefully placed with dressing of Adaptic, Xeroform, and gauze followed by volar and dorsal splints.  He had excellent refill.  He was woken very gently and taken to recovery room.  All sponge, needle, and instrument counts were reported as correct.  We will rehab him according to a modified program in our office if suitable.  He is likely going to have a significant burden in terms of normal capabilities that are going to be loss to the hand. Nevertheless, we were able to achieve a nice repair about the areas. Every digital nerve was lacerated except for the radial digital nerve to the small finger.  This is a tremendous disability in itself.  The blood flow issues  were also always worrisome in terms of cold intolerance and healing.  In addition to this, we also have to fight the good fight of rehabbing the flexor tendon reconstruction.  Thus, I feel this gentleman has a large challenge in front of him and hopefully he is suitable and has maturity to get through this, although he was highly apprehensive as one would expect preoperatively.  We are going to admit him for IV antibiotics, vascular protocol, and monitor him closely.     Dionne Ano. Amanda Pea, M.D.     [facility]  D:  05/02/2012  T:  05/02/2012  Job:  [id number]

## 2012-05-02 NOTE — Op Note (Signed)
See dictation [id number] Dominica Severin MD

## 2012-05-02 NOTE — Anesthesia Postprocedure Evaluation (Signed)
Anesthesia Post Note  Patient: [name]  Procedure(s) Performed: Procedure(s) (LRB): IRRIGATION AND DEBRIDEMENT EXTREMITY (Left) NERVE AND TENDON REPAIR (Left)  Anesthesia type: general  Patient location: PACU  Post pain: Pain level controlled  Post assessment: Patient's Cardiovascular Status Stable  Last Vitals:  Filed Vitals:   05/02/12 0045  BP: 126/71  Pulse: 82  Temp: 37.1 C  Resp: 18    Post vital signs: Reviewed and stable  Level of consciousness: sedated  Complications: No apparent anesthesia complications

## 2012-05-03 ENCOUNTER — Encounter (HOSPITAL_COMMUNITY): Payer: Self-pay | Admitting: Orthopedic Surgery

## 2012-05-03 MED ORDER — CEPHALEXIN 500 MG PO CAPS: 500.0000 mg | ORAL_CAPSULE | Freq: Four times a day (QID) | ORAL | Status: DC

## 2012-05-03 MED ORDER — OXYCODONE HCL 5 MG PO TABS: 5.0000 mg | ORAL_TABLET | ORAL | Status: DC | PRN

## 2012-05-03 NOTE — Progress Notes (Signed)
Subjective: 2 Days Post-Op Procedure(s) (LRB): IRRIGATION AND DEBRIDEMENT EXTREMITY (Left) NERVE AND TENDON REPAIR (Left) Patient reports pain as mild.    He has not had a bowel movement yet. We have discussed the merits of using a stool softener and using a suppository.  He denies headache numbness tingling in the feet or right hand. Abdomen is nontender  Objective: Vital signs in last 24 hours: Temp:  [98.7 F (37.1 C)-99.2 F (37.3 C)] 98.9 F (37.2 C) (11/24 0703) Pulse Rate:  [75-87] 78  (11/24 0703) Resp:  [15-18] 18  (11/24 0703) BP: (117-159)/(69-76) 122/76 mmHg (11/24 0703) SpO2:  [96 %-97 %] 96 % (11/24 0703) Weight:  [122.471 kg (270 lb)] 122.471 kg (270 lb) (11/23 2100)  Intake/Output from previous day: 11/23 0701 - 11/24 0700 In: 940 [P.O.:640; I.V.:200; IV Piggyback:100] Out: 1000 [Urine:1000] Intake/Output this shift:     Basename 05/02/12 0237 05/01/12 1836 05/01/12 1810  HGB 14.4 17.3* 16.5    Basename 05/02/12 0237 05/01/12 1836 05/01/12 1810  WBC 9.3 -- 14.0*  RBC 4.80 -- 5.54  HCT 41.6 51.0 --  PLT 250 -- 326    Basename 05/02/12 0237 05/01/12 1836  NA 138 143  K 4.1 4.1  CL 106 106  CO2 23 --  BUN 14 16  CREATININE 0.94 1.00  GLUCOSE 144* 104*  CALCIUM 8.9 --   No results found for this basename: LABPT:2,INR:2 in the last 72 hours Patient has intact refill to his index middle ring and small fingers. His refill is less than 2 seconds. He has no complications today. His bandages clean dry and intact.  He is tolerating a regular diet his right upper extremity is neurovascular intact lower stem examination is benign.  I discussed him all issues overall his examination is quite stable. There's been no complications postoperatively. ABD soft No cellulitis present Compartment soft  Assessment/Plan: 2 Days Post-Op Procedure(s) (LRB): IRRIGATION AND DEBRIDEMENT EXTREMITY (Left) NERVE AND TENDON REPAIR (Left) Plan for discharge tomorrow  we  will continue IV antibiotics as well as blood thinners secondary to the vascular repairs. We are plan for discharge tomorrow. I discussed with him the discharge medicines of vitamin C Peri-Colace aspirin twice a day as well as antibiotics and pain medicine. I discussed and the merits of the postop hand and upper extremity rebook patient who will be involved in. He understands the relevant pathophysiology and the plans for the future. Should he have any worsening or problems to let know otherwise we'll plan for discharge tomorrow and I preliminarily set this up for him.  Overall is doing very well he has a tall hill to climb but is focused to do so  Rontae Inglett III,Sandra Tellefsen M 05/03/2012, 12:00 PM

## 2012-05-03 NOTE — Progress Notes (Signed)
Utilization Review Completed.[name] T11/24/2013   

## 2012-05-03 NOTE — Progress Notes (Signed)
Occupational Therapy Evaluation Patient Details Name: JAMARD HARPIN MRN: [id number] DOB: 02/01/1971 Today's Date: 05/03/2012 Time: 1308-6578 OT Time Calculation (min): 17 min  OT Assessment / Plan / Recommendation Clinical Impression  41 yo s/p saw injury L hand with multiple tendon, nerve and vessel injuries. Pt underwent ORIF and repair of soft tissues. Pt in flexor tendon repair splint. Educated pt/family on importance of following precautions regarding NO ROM or pushing, pulling or lifting with LUE. Completed education regarding 1 handed and compensatory techniques for ADL. All further rehab will be completed as directed by Dr. Butler Denmark.     OT Assessment  All further OT needs can be met in the next venue of care    Follow Up Recommendations  Other (comment) (outpt OT when appropriate)    Barriers to Discharge  none    Equipment Recommendations  None recommended by OT    Recommendations for Other Services  none  Frequency    eval only   Precautions / Restrictions Precautions Precautions: Other (comment) (no ROM fingers) Restrictions Weight Bearing Restrictions: Yes LUE Weight Bearing: Non weight bearing   Pertinent Vitals/Pain 5/10. Pain meds given    ADL  Eating/Feeding: Set up Where Assessed - Eating/Feeding: Chair Grooming: Minimal assistance Where Assessed - Grooming: Unsupported sitting Upper Body Bathing: Moderate assistance Where Assessed - Upper Body Bathing: Unsupported sitting Lower Body Bathing: Minimal assistance Where Assessed - Lower Body Bathing: Unsupported sit to stand Upper Body Dressing: Moderate assistance Where Assessed - Upper Body Dressing: Unsupported sitting Lower Body Dressing: Minimal assistance Where Assessed - Lower Body Dressing: Unsupported sit to stand Toilet Transfer: Supervision/safety Toilet Transfer Method: Sit to stand Transfers/Ambulation Related to ADLs: S ADL Comments: Educated pt/family on 1 handed ADL compensatory techniques.  Discussed optimal containers and techniques to increase independence and adhere to precautions. Information given about sterring knobs when pt is allowed to drive. Pt demonstrated understanding    OT Diagnosis: Generalized weakness;Acute pain  OT Problem List: Decreased strength;Decreased range of motion;Decreased coordination;Decreased knowledge of use of DME or AE;Decreased knowledge of precautions;Pain;Impaired UE functional use OT Treatment Interventions:     OT Goals Acute Rehab OT Goals OT Goal Formulation:  (eval only)  Visit Information  Last OT Received On: 05/03/12 Assistance Needed: +1    Subjective Data      Prior Functioning     Home Living Lives With: Family Available Help at Discharge: Family Type of Home: House Home Layout: One level Bathroom Shower/Tub: Engineer, manufacturing systems: Standard Home Adaptive Equipment: None Prior Function Level of Independence: Independent Able to Take Stairs?: Yes Driving: Yes Vocation: Full time employment Communication Communication: No difficulties Dominant Hand: Right         Vision/Perception     Cognition  Overall Cognitive Status: Appears within functional limits for tasks assessed/performed Arousal/Alertness: Awake/alert Orientation Level: Appears intact for tasks assessed Behavior During Session: [facility] for tasks performed    Extremity/Trunk Assessment Right Upper Extremity Assessment RUE ROM/Strength/Tone: WFL for tasks assessed RUE Sensation: WFL - Light Touch;WFL - Proprioception RUE Coordination: WFL - gross/fine motor Left Upper Extremity Assessment LUE ROM/Strength/Tone: Deficits;Due to pain;Due to precautions LUE ROM/Strength/Tone Deficits: shoulder/elbow ROM WFL. no wrist/hand ROM allowed LUE Sensation: Deficits LUE Sensation Deficits: unable to fully assess LUE Coordination: WFL - gross/fine motor Right Lower Extremity Assessment RLE ROM/Strength/Tone: WFL for tasks assessed RLE  Sensation: WFL - Light Touch;WFL - Proprioception RLE Coordination: WFL - gross/fine motor Left Lower Extremity Assessment LLE ROM/Strength/Tone: WFL for tasks assessed  LLE Sensation: WFL - Light Touch;WFL - Proprioception LLE Coordination: WFL - gross/fine motor Trunk Assessment Trunk Assessment: Normal     Mobility Bed Mobility Bed Mobility: Not assessed (per report - pt independent)     Shoulder Instructions     Exercise  NO ROM L hand   Balance  WFL   End of Session OT - End of Session Activity Tolerance: Patient tolerated treatment well Patient left: in chair;with call bell/phone within reach;with family/visitor present Nurse Communication: Mobility status  GO Functional Assessment Tool Used: clinical judgement Functional Limitation: Self care Self Care Current Status (Z6109): At least 20 percent but less than 40 percent impaired, limited or restricted Self Care Goal Status (U0454): At least 1 percent but less than 20 percent impaired, limited or restricted Self Care Discharge Status [phone]): At least 1 percent but less than 20 percent impaired, limited or restricted   Daina Cara,HILLARY 05/03/2012, 2:10 PM [facility], OTR/L  [phone] 05/03/2012

## 2012-05-03 NOTE — Progress Notes (Signed)
Pt resting well between pain medication administration. C/o severe shooting pain in middle finger which requires morphine for pain relief.

## 2012-05-04 ENCOUNTER — Encounter (HOSPITAL_COMMUNITY): Payer: Self-pay | Admitting: Orthopedic Surgery

## 2012-05-04 NOTE — Progress Notes (Signed)
Pt provided with discharge instructions and follow up appointment information. Prescriptions given to the patient along with medication regimen. Pt is in stable condition with no changes from this morning. Pt to go home with wife and have outpatient OT.

## 2012-05-04 NOTE — Discharge Summary (Signed)
Physician Discharge Summary  Patient ID: [name] MRN: [id number] DOB/AGE: 43/22/1972 43 y.o.  Admit date: 05/01/2012 Discharge date: 05/04/2012  Admission Diagnoses: Table saw injury to the left hand With reconstruction of the index middle ring and small fingers performed this admission including tendon nerve and artery repairs  Discharge Diagnoses: Same Active Problems:  * No active hospital problems. *    Discharged Condition: good  Hospital Course: Patient was admitted after reconstruction of his left hand due to table saw injury. The patient was placed on anticoagulants as well as other measures. He was placed on aspirin dextran and had SCD toes for DVT prophylaxis. He did well throughout his hospital stay maintaining good refill to the fingers. He has no signs of infection present time. He is been on antibiotics.  He is tolerating a regular diet he has no signs of infection or dystrophic reaction at this juncture.  The a patient is alert and oriented ready for discharge. He is voiding he is tolerating his diet his hand is stable with good refill. Will begin therapy and follow up visit of Monday at my office as scheduled at 10:30 AM.  Consults:   Significant Diagnostic Studies: labs: Please see chart  Treatments please see surgical note  Discharge Exam: Blood pressure 135/78, pulse 82, temperature 98.9 F (37.2 C), temperature source Oral, resp. rate 16, height 5\' 10"  (1.778 m), weight 122.471 kg (270 lb), SpO2 98.00%. General appearance: alert, cooperative and appears stated age patient's exam looks much improved he has no signs of infection dystrophy or nerve asked her compromise in the right upper extremity. His left upper extremity has good refill bandages clean dry and intact.Marland KitchenMarland KitchenThe patient is alert and oriented in no acute distress the patient complains of pain in the affected upper extremity. The patient is noted to have a normal HEENT exam. Lung fields show equal chest  expansion and no shortness of breath abdomen exam is nontender without distention. Lower extremity examination does not show any fracture dislocation or blood clot symptoms. Pelvis is stable neck and back are stable and nontender  Disposition: 01-Home or Self Care     Medication List     As of 05/04/2012 12:31 PM    TAKE these medications         cephALEXin 500 MG capsule   Commonly known as: KEFLEX   Take 1 capsule (500 mg total) by mouth 4 (four) times daily.      oxyCODONE 5 MG immediate release tablet   Commonly known as: Oxy IR/ROXICODONE   Take 1-2 tablets (5-10 mg total) by mouth every 4 (four) hours as needed.           Follow-up Information    Follow up with Karen Chafe, MD. (Please call [phone] to see Dr. Amanda Pea and his therapist on the same day next Monday.)    Contact information:   [address] [place] [address] [phone]          Signed: Karen Chafe 05/04/2012, 12:31 PM

## 2012-05-04 NOTE — Progress Notes (Signed)
Patient ID: [name], male   DOB: 03-02-1971, 41 y.o.   MRN: [id number] Please see discharge summary patient is doing quite well today without comp caring features. We'll see him back Monday in my office at 10:30 AM. Dominica Severin

## 2012-05-05 ENCOUNTER — Encounter (HOSPITAL_COMMUNITY): Payer: Self-pay

## 2012-07-02 ENCOUNTER — Other Ambulatory Visit (HOSPITAL_COMMUNITY): Payer: Self-pay | Admitting: Cardiology

## 2012-07-02 DIAGNOSIS — I10 Essential (primary) hypertension: Secondary | ICD-10-CM

## 2012-07-02 DIAGNOSIS — R002 Palpitations: Secondary | ICD-10-CM

## 2012-07-03 ENCOUNTER — Other Ambulatory Visit (HOSPITAL_COMMUNITY): Payer: Self-pay | Admitting: Cardiology

## 2012-07-03 DIAGNOSIS — I517 Cardiomegaly: Secondary | ICD-10-CM

## 2012-07-03 DIAGNOSIS — R0789 Other chest pain: Secondary | ICD-10-CM

## 2012-07-03 DIAGNOSIS — R002 Palpitations: Secondary | ICD-10-CM

## 2012-07-03 DIAGNOSIS — I10 Essential (primary) hypertension: Secondary | ICD-10-CM

## 2012-07-09 ENCOUNTER — Ambulatory Visit (HOSPITAL_COMMUNITY)
Admission: RE | Admit: 2012-07-09 | Discharge: 2012-07-09 | Disposition: A | Discharge status: Home / Self Care | Payer: BC Managed Care – PPO | Source: Ambulatory Visit | Attending: Cardiology | Admitting: Cardiology

## 2012-07-09 DIAGNOSIS — R002 Palpitations: Secondary | ICD-10-CM

## 2012-07-09 DIAGNOSIS — R0789 Other chest pain: Secondary | ICD-10-CM

## 2012-07-09 DIAGNOSIS — I10 Essential (primary) hypertension: Secondary | ICD-10-CM

## 2012-07-09 DIAGNOSIS — I517 Cardiomegaly: Secondary | ICD-10-CM

## 2012-07-09 HISTORY — PX: DOPPLER ECHOCARDIOGRAPHY: SHX263

## 2012-07-09 NOTE — Progress Notes (Signed)
2D Echo Performed 07/09/2012    Keron Neenan, RCS  

## 2012-10-13 DIAGNOSIS — I4729 Other ventricular tachycardia: Secondary | ICD-10-CM

## 2012-10-13 DIAGNOSIS — I472 Ventricular tachycardia: Secondary | ICD-10-CM

## 2012-10-13 HISTORY — DX: Other ventricular tachycardia: I47.29

## 2012-10-13 HISTORY — DX: Ventricular tachycardia: I47.2

## 2012-11-17 ENCOUNTER — Encounter: Payer: Self-pay | Admitting: Cardiology

## 2012-11-17 ENCOUNTER — Ambulatory Visit (INDEPENDENT_AMBULATORY_CARE_PROVIDER_SITE_OTHER): Payer: BC Managed Care – PPO | Admitting: Cardiology

## 2012-11-17 VITALS — BP 138/84 | HR 80 | Ht 69.0 in | Wt 259.1 lb

## 2012-11-17 DIAGNOSIS — R002 Palpitations: Secondary | ICD-10-CM

## 2012-11-17 DIAGNOSIS — I4729 Other ventricular tachycardia: Secondary | ICD-10-CM

## 2012-11-17 DIAGNOSIS — I472 Ventricular tachycardia, unspecified: Secondary | ICD-10-CM

## 2012-11-17 NOTE — Patient Instructions (Addendum)
Your physician has requested that you have en exercise stress myoview. For further information please visit [email or web address] Please follow instruction sheet, as given.  Your physician recommends that you schedule a follow-up appointment in 2-3 weeks to follow up myoview test  . Your physician recommends that you continue on your current medications as directed. Please refer to the Current Medication list given to you today.

## 2012-11-17 NOTE — Progress Notes (Signed)
Patient ID: [name], male   DOB: 19-Sep-1970, 42 y.o.   MRN: [id number]  Clinic Note: HPI: [name] is a 42 y.o. male with a PMH below who presents today for followup after his event monitor. He will monitor from the May 7 to June 5. He had multiple episodes of PACs and PVCs, couplets and triplets and several short runs of PAT and one run of 4-5 beats of nonsustained ventricular tachycardia. Along with another triplet.  We actually went to his long of concerning symptoms and a Promus correlated with either arrival the PACU her frequent PVCs in a row and he did note the long horn of PAT and the 2 nonsustained ventricular tachycardia runs. He still seems very reluctant to do anything further can be doesn't note any any syncope or near-syncopal type episodes with this. Denies any chest discomfort the side effect he notices a PVCs and PACs. He denies any dyspnea with rest or exertion denies any heart failure symptoms of PND, orthopnea or edema.  He denies any TIA or amaurosis fugax symptoms. Despite his also s he does not note any recent GI bleeding.   past history includes event monitor results Past Medical History  Diagnosis Date  . Heart palpitations     Frequent PACs & PVC with intermittent short runs of PAT & NSVT  . Nonsustained ventricular tachycardia 5-6 / 2014    Noted on Monitor 5/7-6/5   . GERD (gastroesophageal reflux disease)   . Generalized anxiety disorder   . Ulcerative colitis, chronic     with duodenal ulcer  . Psoriasis vulgaris     Prior Cardiac Evaluation and Past Surgical History: Past Surgical History  Procedure Laterality Date  . I&d extremity  05/01/2012    Procedure: IRRIGATION AND DEBRIDEMENT EXTREMITY;  Surgeon: Dominica Severin, MD;  [facility];  Service: Orthopedics;  Laterality: Left;  . Nerve and tendon repair  05/01/2012    Procedure: NERVE AND TENDON REPAIR;  Surgeon: Dominica Severin, MD;  Location: [facility] OR;  Service: Orthopedics;  Laterality: Left;  .  Doppler echocardiography  07/09/2012    EF 55-0%, mild LVH, otherwise normal   No Known Allergies  Current Outpatient Prescriptions  Medication Sig Dispense Refill  . busPIRone (BUSPAR) 10 MG tablet Take 1/2 tablet bid,prn      . mesalamine (LIALDA) 1.2 G EC tablet Take 1,200 mg by mouth. ,prn      . omeprazole (PRILOSEC OTC) 20 MG tablet Take 20 mg by mouth daily.      . Probiotic Product (PROBIOTIC DAILY PO) Take by mouth.       No current facility-administered medications for this visit.    History   Social History  . Marital Status: Married    Spouse Name: N/A    Number of Children: N/A  . Years of Education: N/A   Occupational History  . Not on file.   Social History Main Topics  . Smoking status: Former Games developer  . Smokeless tobacco: Former Neurosurgeon    Quit date: 11/18/1986     Comment: smoked high school  . Alcohol Use: No  . Drug Use: Not on file  . Sexually Active: Not on file   Other Topics Concern  . Not on file   Social History Narrative  . No narrative on file    ROS: A comprehensive Review of Systems - Negative except Pertinent positives noted above and several small minor pauses below. Psychological ROS: positive for - anxiety,  concentration difficulties, sleep disturbances and Brief episodes of the common hepatic attacks Gastrointestinal ROS: positive for - gas/bloating, heartburn and Intermittent loose stools and denies any melena or hematochezia.  PHYSICAL EXAM BP 138/84  Pulse 80  Ht 5\' 9"  (1.753 m)  Wt 259 lb 1.6 oz (117.527 kg)  BMI 38.24 kg/m2 -- due to awakening from last visit General appearance: alert, cooperative, appears stated age, no distress, mildly obese and Very anxious and nervous appearing. Speech very significant stutter this is baseline. Well-nourished well-groomed. Neck: no adenopathy, no carotid bruit, no JVD, supple, symmetrical, trachea midline and thyroid not enlarged, symmetric, no tenderness/mass/nodules Lungs: clear to  auscultation bilaterally, normal percussion bilaterally and Nonlabored, good air movement Heart: regularly irregular rhythm, S1, S2 normal, no S3 or S4, no rub and No murmur. Frequent ectopy. Abdomen: normal findings: liver span normal to percussion, no bruits heard, no organomegaly, no scars, striae, dilated veins, rashes, or lesions, soft, non-tender and umbilicus normal and abnormal findings:  Mild tenderness and increased bowel sounds as expected. Extremities: extremities normal, atraumatic, no cyanosis or edema and no edema, redness or tenderness in the calves or thighs Pulses: 2+ and symmetric Skin: Diffuse patches of psoriatic rash over most of the lower immitis. Neurologic: Grossly normal  NWG:[id number] today: No  Recent Labs:  ASSESSMENT: As I expected, his  Patient Active Problem List   Diagnosis Date Noted  . NSVT (nonsustained ventricular tachycardia) 11/18/2012  . Heart palpitations    PLAN: Per problem list. Orders Placed This Encounter  Procedures  . Myocardial Perfusion Imaging    Standing Status: Future     Number of Occurrences:      Standing Expiration Date: 11/17/2013    Order Specific Question:  Where should this test be performed    Answer:  [facility]    Order Specific Question:  Type of stress    Answer:  Lexiscan    Order Specific Question:  Patient weight in lbs    Answer:  259    Followup: 2-3 weeks, and after his stress test   Marykay Lex, M.D., M.S. [facility] [address] [place], [place]  [address]  [phone] Pager # [phone] 11/18/2012 10:21 PM

## 2012-11-18 ENCOUNTER — Encounter: Payer: Self-pay | Admitting: Cardiology

## 2012-11-18 DIAGNOSIS — I4729 Other ventricular tachycardia: Secondary | ICD-10-CM | POA: Insufficient documentation

## 2012-11-18 DIAGNOSIS — I472 Ventricular tachycardia: Secondary | ICD-10-CM | POA: Insufficient documentation

## 2012-11-18 DIAGNOSIS — R002 Palpitations: Secondary | ICD-10-CM | POA: Insufficient documentation

## 2012-11-18 NOTE — Assessment & Plan Note (Signed)
Is no evidence of ischemia on the Myoview, the PACs and PVCs are probably likely benign. He is reluctant to do any treatment unless his evidence of ischemia. He does continue to monitor him and he says almost every his family has these is no evidence of ischemia would be fine but probably not using beta blocker treatment.

## 2012-11-18 NOTE — Assessment & Plan Note (Signed)
Although he denies any true anginal symptoms or any heart failure so. I think it the presence of nonsustained ventricular tachycardia on the monitor a concern enough that we should probably insure there is no potential ischemic complement of this. The PACs and PVCs are probably benign with a normal ejection fraction, however I'd like to rule out ischemia. Most were done that we can discuss the possibility of the use of beta blocker for treatment. If there is any evidence of ischemia with a peak the use of beta blocker would be very important. If not, I would defer to his desire not to be on medications.

## 2012-11-20 ENCOUNTER — Ambulatory Visit (HOSPITAL_COMMUNITY)
Admission: RE | Admit: 2012-11-20 | Discharge: 2012-11-20 | Disposition: A | Discharge status: Home / Self Care | Payer: BC Managed Care – PPO | Source: Ambulatory Visit | Attending: Cardiovascular Disease | Admitting: Cardiovascular Disease

## 2012-11-20 DIAGNOSIS — R0609 Other forms of dyspnea: Secondary | ICD-10-CM | POA: Insufficient documentation

## 2012-11-20 DIAGNOSIS — R002 Palpitations: Secondary | ICD-10-CM | POA: Insufficient documentation

## 2012-11-20 DIAGNOSIS — R0989 Other specified symptoms and signs involving the circulatory and respiratory systems: Secondary | ICD-10-CM | POA: Insufficient documentation

## 2012-11-20 DIAGNOSIS — I4729 Other ventricular tachycardia: Secondary | ICD-10-CM

## 2012-11-20 DIAGNOSIS — R9431 Abnormal electrocardiogram [ECG] [EKG]: Secondary | ICD-10-CM | POA: Insufficient documentation

## 2012-11-20 DIAGNOSIS — I472 Ventricular tachycardia, unspecified: Secondary | ICD-10-CM | POA: Insufficient documentation

## 2012-11-20 MED ORDER — REGADENOSON 0.4 MG/5ML IV SOLN
0.4000 mg | Freq: Once | INTRAVENOUS | Status: AC
  Administered 2012-11-20: 0.4 mg via INTRAVENOUS

## 2012-11-20 MED ORDER — TECHNETIUM TC 99M SESTAMIBI GENERIC - CARDIOLITE
11.0000 | Freq: Once | INTRAVENOUS | Status: AC | PRN
  Administered 2012-11-20: 11 via INTRAVENOUS

## 2012-11-20 MED ORDER — TECHNETIUM TC 99M SESTAMIBI GENERIC - CARDIOLITE
31.0000 | Freq: Once | INTRAVENOUS | Status: AC | PRN
  Administered 2012-11-20: 31 via INTRAVENOUS

## 2012-11-20 NOTE — Procedures (Addendum)
 [facility] [address] [place] [address] [phone]  Cardiology Nuclear Med Study  [name] is a 42 y.o. male     MRN : [id number]     DOB: [birth year]  Procedure Date: 11/20/2012  Nuclear Med Background Indication for Stress Test:  Evaluation for Ischemia and Abnormal EKG History:  NO PRIOR HISTORY REPORTED Cardiac Risk Factors: History of Smoking, Hypertension and Overweight  Symptoms:  DOE and Palpitations   Nuclear Pre-Procedure Caffeine/Decaff Intake:  7:00pm NPO After: 5:00am   IV Site: R Forearm  IV 0.9% NS with Angio Cath:  22g  Chest Size (in):  44"  IV Started by: Emmit Pomfret, RN  Height: 5\' 9"  (1.753 m)  Cup Size: n/a  BMI:  Body mass index is 38.23 kg/(m^2). Weight:  259 lb (117.482 kg)   Tech Comments:  N/A    Nuclear Med Study 1 or 2 day study: 1 day  Stress Test Type:  Stress  Order Authorizing Provider:  Bryan Lemma, MD   Resting Radionuclide: Technetium 37m Sestamibi  Resting Radionuclide Dose: 11.0 mCi   Stress Radionuclide:  Technetium 47m Sestamibi  Stress Radionuclide Dose: 31.0 mCi           Stress Protocol Rest HR: 81 Stress HR: 151  Rest BP: 164/89 Stress BP: 192/80  Exercise Time (min): 8:05 METS: 10.40   Predicted Max HR: 177 bpm % Max HR: 85.31 bpm Rate Pressure Product: 78469  Dose of Adenosine (mg):  n/a Dose of Lexiscan: n/a mg  Dose of Atropine (mg): n/a Dose of Dobutamine: n/a mcg/kg/min (at max HR)  Stress Test Technologist: Ernestene Mention, CCT Nuclear Technologist: Gonzella Lex, CNMT   Rest Procedure:  Myocardial perfusion imaging was performed at rest 45 minutes following the intravenous administration of Technetium 9m Sestamibi. Stress Procedure:  The patient performed treadmill exercise using a Bruce  Protocol for 8 minutes and 5 seconds. The patient stopped due to fatigue and shortness of breath. Patient denied any chest pain.  There were no significant ST-T  wave changes.  Technetium 74m Sestamibi was injected at peak exercise and myocardial perfusion imaging was performed after a brief delay.  Transient Ischemic Dilatation (Normal <1.22):  0.85 Lung/Heart Ratio (Normal <0.45):  0.36 QGS EDV:  115 ml QGS ESV:  43 ml LV Ejection Fraction: 63%      Rest ECG: NSR - Normal EKG  Stress ECG: No significant change from baseline ECG  QPS Raw Data Images:  Mild diaphragmatic attenuation.  Normal left ventricular size. Stress Images:  Normal homogeneous uptake in all areas of the myocardium. Rest Images:  Normal homogeneous uptake in all areas of the myocardium. Subtraction (SDS):  No evidence of ischemia.  Impression Exercise Capacity:  Good exercise capacity. BP Response:  HTn at baseline with appropriate response to exercise Clinical Symptoms:  No significant symptoms noted. ECG Impression:  No significant ST segment change suggestive of ischemia. Comparison with Prior Nuclear Study: No previous nuclear study performed  Overall Impression:  Normal stress nuclear study. Mild diaphragmatic attenuation artifact.  LV Wall Motion:  NL LV Function; NL Wall Motion   Reichen Hutzler, MD  11/20/2012 12:56 PM

## 2012-11-23 ENCOUNTER — Telehealth: Payer: Self-pay | Admitting: *Deleted

## 2012-11-23 NOTE — Telephone Encounter (Signed)
Spoke to patient. Results given. 

## 2012-11-23 NOTE — Telephone Encounter (Signed)
Message copied by Tobin Chad on Mon Nov 23, 2012 10:50 AM ------      Message from: [facility], DAVID      Created: Fri Nov 20, 2012  1:30 PM       Good news -- no evidence of heart artery blockages.       No ischemia or infarction.            Marykay Lex, MD       ------

## 2013-01-02 ENCOUNTER — Encounter: Payer: Self-pay | Admitting: Cardiology

## 2013-04-15 ENCOUNTER — Other Ambulatory Visit: Payer: Self-pay

## 2013-06-08 ENCOUNTER — Other Ambulatory Visit: Payer: Self-pay | Admitting: Gastroenterology

## 2013-10-12 ENCOUNTER — Ambulatory Visit
Admission: RE | Admit: 2013-10-12 | Discharge: 2013-10-12 | Disposition: A | Discharge status: Home / Self Care | Payer: BC Managed Care – PPO | Source: Ambulatory Visit | Attending: Gastroenterology | Admitting: Gastroenterology

## 2013-10-12 ENCOUNTER — Other Ambulatory Visit: Payer: Self-pay | Admitting: Gastroenterology

## 2013-10-12 DIAGNOSIS — K5150 Left sided colitis without complications: Secondary | ICD-10-CM

## 2014-10-12 ENCOUNTER — Ambulatory Visit (INDEPENDENT_AMBULATORY_CARE_PROVIDER_SITE_OTHER): Payer: BLUE CROSS/BLUE SHIELD

## 2014-10-12 ENCOUNTER — Ambulatory Visit (INDEPENDENT_AMBULATORY_CARE_PROVIDER_SITE_OTHER): Payer: BLUE CROSS/BLUE SHIELD | Admitting: Podiatry

## 2014-10-12 ENCOUNTER — Encounter: Payer: Self-pay | Admitting: Podiatry

## 2014-10-12 VITALS — Ht 70.0 in | Wt 260.0 lb

## 2014-10-12 DIAGNOSIS — M7662 Achilles tendinitis, left leg: Secondary | ICD-10-CM

## 2014-10-12 DIAGNOSIS — M722 Plantar fascial fibromatosis: Secondary | ICD-10-CM

## 2014-10-12 MED ORDER — TRIAMCINOLONE ACETONIDE 10 MG/ML IJ SUSP
10.0000 mg | Freq: Once | INTRAMUSCULAR | Status: AC
  Administered 2014-10-12: 10 mg

## 2014-10-12 NOTE — Progress Notes (Signed)
   Subjective:    Patient ID: [name], male    DOB: 1970/09/17, 44 y.o.   MRN: [id number]  HPI    Review of Systems  Musculoskeletal:       Pt states he had his left index finger taken off surgically 3 weeks ago, after an accident years ago.  All other systems reviewed and are negative.      Objective:   Physical Exam        Assessment & Plan:

## 2014-10-12 NOTE — Patient Instructions (Signed)

## 2014-10-13 NOTE — Progress Notes (Signed)
Subjective:     Patient ID: [name], male   DOB: May 16, 1971, 44 y.o.   MRN: [id number]  HPI patient presents with a lot of pain in the posterior aspect of the left heel with mild discomfort in the plantar heel. States it's been tender for a couple months and is worsened over the last few weeks   Review of Systems  All other systems reviewed and are negative.      Objective:   Physical Exam  Constitutional: He is oriented to person, place, and time.  Cardiovascular: Intact distal pulses.   Musculoskeletal: Normal range of motion.  Neurological: He is oriented to person, place, and time.  Skin: Skin is warm.  Nursing note and vitals reviewed.  neurovascular status found to be intact with muscle strength adequate range of motion within normal limits. Patient's noted to have good digital perfusion is well oriented 3 and I noted quite a bit of discomfort in the posterior aspect of left heel lateral side at the Achilles tendon insertion with inflammation and fluid noted. Achilles tendinitis left with mild plantar fasciitis left and pain with pressure     Assessment:     Listed above assessment    Plan:     H&P and condition discussed with patient. Today I have recommended careful injection of this area and explained a chance and risk of rupture. Patient wants procedure and today on the lateral side I injected 3 mg dexamethasone Kenalog 5 mg Xylocaine advised on ice and applied air fracture walker to completely immobilize. Reappoint for us to recheck in 3 weeks or earlier if any issues should occur

## 2014-11-03 ENCOUNTER — Ambulatory Visit: Payer: BLUE CROSS/BLUE SHIELD | Admitting: Podiatry

## 2014-12-28 ENCOUNTER — Encounter: Payer: Self-pay | Admitting: *Deleted

## 2015-02-08 ENCOUNTER — Encounter: Payer: Self-pay | Admitting: Cardiology

## 2015-03-18 ENCOUNTER — Emergency Department (HOSPITAL_COMMUNITY)
Admission: EM | Admit: 2015-03-18 | Discharge: 2015-03-18 | Disposition: A | Discharge status: Home / Self Care | Payer: BLUE CROSS/BLUE SHIELD | Attending: Emergency Medicine | Admitting: Emergency Medicine

## 2015-03-18 ENCOUNTER — Encounter (HOSPITAL_COMMUNITY): Payer: Self-pay

## 2015-03-18 DIAGNOSIS — K219 Gastro-esophageal reflux disease without esophagitis: Secondary | ICD-10-CM | POA: Insufficient documentation

## 2015-03-18 DIAGNOSIS — Z79899 Other long term (current) drug therapy: Secondary | ICD-10-CM | POA: Diagnosis not present

## 2015-03-18 DIAGNOSIS — F411 Generalized anxiety disorder: Secondary | ICD-10-CM | POA: Insufficient documentation

## 2015-03-18 DIAGNOSIS — Z8679 Personal history of other diseases of the circulatory system: Secondary | ICD-10-CM | POA: Diagnosis not present

## 2015-03-18 DIAGNOSIS — M1990 Unspecified osteoarthritis, unspecified site: Secondary | ICD-10-CM | POA: Insufficient documentation

## 2015-03-18 DIAGNOSIS — M25562 Pain in left knee: Secondary | ICD-10-CM | POA: Diagnosis not present

## 2015-03-18 DIAGNOSIS — Z87891 Personal history of nicotine dependence: Secondary | ICD-10-CM | POA: Diagnosis not present

## 2015-03-18 DIAGNOSIS — Z872 Personal history of diseases of the skin and subcutaneous tissue: Secondary | ICD-10-CM | POA: Diagnosis not present

## 2015-03-18 MED ORDER — IBUPROFEN 400 MG PO TABS
400.0000 mg | ORAL_TABLET | Freq: Once | ORAL | Status: AC
  Administered 2015-03-18: 400 mg via ORAL
  Filled 2015-03-18: qty 1

## 2015-03-18 NOTE — Discharge Instructions (Signed)

## 2015-03-18 NOTE — ED Notes (Signed)
AT time of D/C Pt requested advil. For pain. Verbal order given for pain med.

## 2015-03-18 NOTE — ED Notes (Signed)
Declined W/C at D/C and was escorted to lobby by RN. 

## 2015-03-18 NOTE — ED Notes (Signed)
Lt. Knee pain began on the 24 th.  Pt. Was squatting done and felt pain,  Denies any injury.  Pt. Unable to put weight on the knee. Was seen at [facility] this Thursday and was advised to go to an orthopedic.  Was placed on Percocet. Which helps with his pain, but he is almost out of them

## 2015-03-18 NOTE — ED Provider Notes (Signed)
CSN: [id number]     Arrival date & time 03/18/15  1128 History  By signing my name below, I, [name], attest that this documentation has been prepared under the direction and in the presence of Russell Horseman, PA-C Electronically Signed: Soijett Pittman, ED Scribe. 03/18/2015. 12:14 PM.   Chief Complaint  Patient presents with  . Knee Pain     The history is provided by the patient. No language interpreter was used.    [name] is a 44 y.o. male with a medical hx of chronic Ulcerative colitis, who presents to the Emergency Department complaining of progressively worsening left knee pain onset 03/04/15. He denies his left knee ever clicking, catching, or popping, and he denies the knee ever feeling unstable. He notes that 5 days ago his left knee began to swell due to this symptom he saw his PCP 3 days ago. He reports that his PCP informed him that nothing was wrong with the knee and was told to take ASA. Pt was seen at [facility] this Thursday and had a negative xray and was advised to go to an orthopedist and was given a Rx for percocet and a knee immobilizer. Pt notes that he does not want to keep taking the percocet even though they work for him. He states that he has not had time to see an orthopedist as of yet.  Pt is having associated symptoms of left knee swelling. He notes that he has tried knee sleeve and ice with no relief of his symptoms. He denies color change, wound, rash, penile discharge, and any other symptoms.    Past Medical History  Diagnosis Date  . Heart palpitations     Frequent PACs & PVC with intermittent short runs of PAT & NSVT  . Nonsustained ventricular tachycardia (HCC) 5-6 / 2014    Noted on Monitor 5/7-6/5   . GERD (gastroesophageal reflux disease)   . Generalized anxiety disorder   . Ulcerative colitis, chronic (HCC)     with duodenal ulcer  . Psoriasis vulgaris   . Arthritis    Past Surgical History  Procedure Laterality Date  . I&d extremity   05/01/2012    Procedure: IRRIGATION AND DEBRIDEMENT EXTREMITY;  Surgeon: Dominica Severin, MD;  Location: [facility] OR;  Service: Orthopedics;  Laterality: Left;  . Nerve and tendon repair  05/01/2012    Procedure: NERVE AND TENDON REPAIR;  Surgeon: Dominica Severin, MD;  Location: [facility] OR;  Service: Orthopedics;  Laterality: Left;  . Doppler echocardiography  07/09/2012    EF 55-0%, mild LVH, otherwise normal   Family History  Problem Relation Age of Onset  . Stroke Father   . Cancer Maternal Grandmother   . Cancer Maternal Grandfather   . Cancer Paternal Grandmother   . Stroke Paternal Grandfather    Social History  Substance Use Topics  . Smoking status: Former Games developer  . Smokeless tobacco: Former Neurosurgeon    Quit date: 11/18/1986     Comment: smoked high school  . Alcohol Use: No    Review of Systems  Genitourinary: Negative for discharge.  Musculoskeletal: Positive for joint swelling and arthralgias.  Skin: Negative for color change, rash and wound.     Allergies  Review of patient's allergies indicates no known allergies.  Home Medications   Prior to Admission medications   Medication Sig Start Date End Date Taking? Authorizing Provider  busPIRone (BUSPAR) 10 MG tablet Take 1/2 tablet bid,prn    Historical Provider, MD  mesalamine (  LIALDA) 1.2 G EC tablet Take 1,200 mg by mouth. ,prn    Historical Provider, MD  omeprazole (PRILOSEC OTC) 20 MG tablet Take 20 mg by mouth daily.    Historical Provider, MD  Oxycodone-Acetaminophen (PERCOCET PO) Take by mouth.    Historical Provider, MD  Probiotic Product (PROBIOTIC DAILY PO) Take by mouth.    Historical Provider, MD   BP 147/80 mmHg  Pulse 96  Temp(Src) 98.7 F (37.1 C) (Oral)  Resp 20  Ht  (1.778 m)  Wt 250 lb (113.399 kg)  BMI 35.87 kg/m2  SpO2 100% Physical Exam  Constitutional: He is oriented to person, place, and time. He appears well-developed and well-nourished. No distress.  HENT:  Head: Normocephalic and  atraumatic.  Eyes: EOM are normal.  Neck: Neck supple.  Cardiovascular: Normal rate.   Pulmonary/Chest: Effort normal. No respiratory distress.  Musculoskeletal: Normal range of motion.  Left knee moderately tender to palpation over the superior medial aspect, moderately swollen, but no palpable effusion, no bony abnormality or deformity, range of motion limited beyond 90 flexion secondary to pain, normal extension, strength is 4/5, no erythema, no evidence of abscess, DVT, or septic joint  Neurological: He is alert and oriented to person, place, and time.  Skin: Skin is warm and dry.  No erythema, cellulitis, or abscess  Psychiatric: He has a normal mood and affect. His behavior is normal.  Nursing note and vitals reviewed.   ED Course  Procedures (including critical care time) DIAGNOSTIC STUDIES: Oxygen Saturation is 100% on RA, nl by my interpretation.    COORDINATION OF CARE:  12:12 PM Discussed treatment plan with pt at bedside which includes f/u and referral to orthopedist, RICE, ibuprofen, prednisone, crutches, and pt agreed to plan.     MDM   Final diagnoses:  Knee pain, acute, left    Patient with left knee pain. He has been seen by his primary care provider, and was told to take aspirin. States that he cannot take aspirin secondary to ulcerative colitis. He was seen at [facility] earlier this week and had plain films of the knee which show no abnormality. Patient reports persistent pain. The pain is worsened with movement and mostly with flexion. There is no cellulitis, erythema, or evidence of septic joint. No calf tenderness, Well's DVT score 0, doubt DVT.  The patient is able to range his knee. I recommend using the knee immobilizer that was provided to him by [facility] emergency department. I will give him crutches and encourage him to be nonweightbearing until he is seen by orthopedics next week. Patient via his to rest and to keep his knee up and  elevated. Strict return precautions given regarding new redness, fever, or increased swelling. Patient understands and agrees with the plan.  I, [name], personally performed the services described in this documentation. All medical record entries made by the scribe were at my direction and in my presence.  I have reviewed the chart and discharge instructions and agree that the record reflects my personal performance and is accurate and complete. Juel Bellerose.  03/18/2015. 12:20 PM.       Russell Horseman, PA-C 03/18/15 1224  Nelva Nay, MD 03/19/15 [phone]

## 2020-05-25 ENCOUNTER — Emergency Department (HOSPITAL_COMMUNITY): Payer: BLUE CROSS/BLUE SHIELD

## 2020-05-25 ENCOUNTER — Emergency Department (HOSPITAL_COMMUNITY)
Admission: EM | Admit: 2020-05-25 | Discharge: 2020-05-26 | Disposition: A | Discharge status: Home / Self Care | Payer: BLUE CROSS/BLUE SHIELD | Attending: Emergency Medicine | Admitting: Emergency Medicine

## 2020-05-25 ENCOUNTER — Other Ambulatory Visit: Payer: Self-pay

## 2020-05-25 DIAGNOSIS — J1282 Pneumonia due to coronavirus disease 2019: Secondary | ICD-10-CM

## 2020-05-25 DIAGNOSIS — Z87891 Personal history of nicotine dependence: Secondary | ICD-10-CM | POA: Insufficient documentation

## 2020-05-25 DIAGNOSIS — U071 COVID-19: Secondary | ICD-10-CM | POA: Insufficient documentation

## 2020-05-25 DIAGNOSIS — R0602 Shortness of breath: Secondary | ICD-10-CM | POA: Diagnosis present

## 2020-05-25 DIAGNOSIS — R0781 Pleurodynia: Secondary | ICD-10-CM

## 2020-05-25 LAB — TROPONIN I (HIGH SENSITIVITY): Troponin I (High Sensitivity): 10 ng/L (ref <18)

## 2020-05-25 NOTE — ED Triage Notes (Signed)
Pt here via pov with reports of chest pain and shob onset with gradual worsening. Pt dx with covid on 12/3. Pt states PCP worried pt may have a blood clot and sent here for CT angio.

## 2020-05-26 ENCOUNTER — Emergency Department (HOSPITAL_COMMUNITY): Payer: BLUE CROSS/BLUE SHIELD

## 2020-05-26 LAB — CBC
HCT: 47.7 % (ref 39.0–52.0)
Hemoglobin: 15.9 g/dL (ref 13.0–17.0)
MCH: 28.6 pg (ref 26.0–34.0)
MCHC: 33.3 g/dL (ref 30.0–36.0)
MCV: 85.9 fL (ref 80.0–100.0)
Platelets: 227 10*3/uL (ref 150–400)
RBC: 5.55 MIL/uL (ref 4.22–5.81)
RDW: 13.6 % (ref 11.5–15.5)
WBC: 5.6 10*3/uL (ref 4.0–10.5)
nRBC: 0 % (ref 0.0–0.2)

## 2020-05-26 LAB — I-STAT CHEM 8, ED
BUN: 50 mg/dL — ABNORMAL HIGH (ref 6–20)
Calcium, Ion: 1.09 mmol/L — ABNORMAL LOW (ref 1.15–1.40)
Chloride: 103 mmol/L (ref 98–111)
Creatinine, Ser: 1.2 mg/dL (ref 0.61–1.24)
Glucose, Bld: 102 mg/dL — ABNORMAL HIGH (ref 70–99)
HCT: 46 % (ref 39.0–52.0)
Hemoglobin: 15.6 g/dL (ref 13.0–17.0)
Potassium: 3.3 mmol/L — ABNORMAL LOW (ref 3.5–5.1)
Sodium: 138 mmol/L (ref 135–145)
TCO2: 24 mmol/L (ref 22–32)

## 2020-05-26 LAB — BASIC METABOLIC PANEL
Anion gap: 13 (ref 5–15)
BUN: 42 mg/dL — ABNORMAL HIGH (ref 6–20)
CO2: 23 mmol/L (ref 22–32)
Calcium: 8.5 mg/dL — ABNORMAL LOW (ref 8.9–10.3)
Chloride: 102 mmol/L (ref 98–111)
Creatinine, Ser: 1.26 mg/dL — ABNORMAL HIGH (ref 0.61–1.24)
GFR, Estimated: >60 mL/min (ref >60)
Glucose, Bld: 107 mg/dL — ABNORMAL HIGH (ref 70–99)
Potassium: 3.2 mmol/L — ABNORMAL LOW (ref 3.5–5.1)
Sodium: 138 mmol/L (ref 135–145)

## 2020-05-26 MED ORDER — BENZONATATE 100 MG PO CAPS: 100.0000 mg | ORAL_CAPSULE | Freq: Three times a day (TID) | ORAL | 0 refills | Status: DC

## 2020-05-26 MED ORDER — IOHEXOL 350 MG/ML SOLN
100.0000 mL | Freq: Once | INTRAVENOUS | Status: AC | PRN
  Administered 2020-05-26: 70 mL via INTRAVENOUS

## 2020-05-26 NOTE — ED Notes (Signed)
gave pt IS and did teaching and return demonstrtation

## 2020-05-26 NOTE — ED Notes (Signed)
Pt O2 remained at 95% while ambulating.  

## 2020-05-26 NOTE — ED Provider Notes (Signed)
[facility] EMERGENCY DEPARTMENT Provider Note   CSN: [id number] Arrival date & time: 05/25/20  1557     History Chief Complaint  Patient presents with   Chest Pain    [name] is a 49 y.o. male.  Patient with history of Crohn's disease, known Covid positive, on approximately day 14 of illness presents the emergency department with continued shortness of breath, cough and worsening chest pain over the past several days.  Patient reports having to take shallow breaths due to the pain.  He states that he does not really feel it unless he takes a deep breath.  He becomes very short of breath with taking a few steps.  Denies lower extremity edema or swelling.  He has had continued episodes of nonbloody diarrhea.  He has had decreased appetite.  Continues to drink fluids per his report.  Patient states that he saw his doctor yesterday and he was referred to the emergency department to rule out a blood clot.  He was not vaccinated against Covid.        Past Medical History:  Diagnosis Date   Arthritis    Generalized anxiety disorder    GERD (gastroesophageal reflux disease)    Heart palpitations    Frequent PACs & PVC with intermittent short runs of PAT & NSVT   Nonsustained ventricular tachycardia (HCC) 5-6 / 2014   Noted on Monitor 5/7-6/5    Psoriasis vulgaris    Ulcerative colitis, chronic (HCC)    with duodenal ulcer    Patient Active Problem List   Diagnosis Date Noted   NSVT (nonsustained ventricular tachycardia) (HCC) 11/18/2012   Heart palpitations     Past Surgical History:  Procedure Laterality Date   DOPPLER ECHOCARDIOGRAPHY  07/09/2012   EF 55-0%, mild LVH, otherwise normal   I & D EXTREMITY  05/01/2012   Procedure: IRRIGATION AND DEBRIDEMENT EXTREMITY;  Surgeon: Dominica Severin, MD;  Location: [facility] OR;  Service: Orthopedics;  Laterality: Left;   NERVE AND TENDON REPAIR  05/01/2012   Procedure: NERVE AND TENDON REPAIR;  Surgeon:  Dominica Severin, MD;  Location: [facility] OR;  Service: Orthopedics;  Laterality: Left;       Family History  Problem Relation Age of Onset   Stroke Father    Cancer Maternal Grandmother    Cancer Maternal Grandfather    Cancer Paternal Grandmother    Stroke Paternal Grandfather     Social History   Tobacco Use   Smoking status: Former Smoker   Smokeless tobacco: Former Neurosurgeon    Quit date: 11/18/1986   Tobacco comment: smoked high school  Substance Use Topics   Alcohol use: No    Home Medications Prior to Admission medications   Medication Sig Start Date End Date Taking? Authorizing Provider  busPIRone (BUSPAR) 10 MG tablet Take 1/2 tablet bid,prn    [provider]  mesalamine (LIALDA) 1.2 G EC tablet Take 1,200 mg by mouth. ,prn    [provider]  omeprazole (PRILOSEC OTC) 20 MG tablet Take 20 mg by mouth daily.    [provider]  Oxycodone-Acetaminophen (PERCOCET PO) Take by mouth.    [provider]  Probiotic Product (PROBIOTIC DAILY PO) Take by mouth.    [provider]    Allergies    Patient has no known allergies.  Review of Systems   Review of Systems  Constitutional: Positive for appetite change. Negative for fever.  HENT: Negative for rhinorrhea and sore throat.  Eyes: Negative for redness.  Respiratory: Positive for cough and shortness of breath.   Cardiovascular: Positive for chest pain.  Gastrointestinal: Positive for diarrhea. Negative for abdominal pain, nausea and vomiting.  Genitourinary: Negative for dysuria and hematuria.  Musculoskeletal: Negative for myalgias.  Skin: Negative for rash.  Neurological: Negative for headaches.    Physical Exam Updated Vital Signs BP 126/81 (BP Location: Right Arm)    Pulse 72    Temp 98.3 F (36.8 C) (Oral)    Resp (!) 23    Ht 5\' 10"  (1.778 m)    Wt 132 kg    SpO2 93%    BMI 41.75 kg/m   Physical Exam Vitals and nursing note reviewed.  Constitutional:       Appearance: He is well-developed and well-nourished.  HENT:     Head: Normocephalic and atraumatic.  Eyes:     General:        Right eye: No discharge.        Left eye: No discharge.     Conjunctiva/sclera: Conjunctivae normal.  Cardiovascular:     Rate and Rhythm: Normal rate and regular rhythm.     Heart sounds: Normal heart sounds.  Pulmonary:     Effort: Pulmonary effort is normal.     Breath sounds: Examination of the right-middle field reveals rales. Examination of the left-middle field reveals rales. Examination of the right-lower field reveals rales. Examination of the left-lower field reveals rales. Rales present.     Comments: Occasional cough during exam Abdominal:     Palpations: Abdomen is soft.     Tenderness: There is no abdominal tenderness.  Musculoskeletal:     Cervical back: Normal range of motion and neck supple.  Skin:    General: Skin is warm and dry.  Neurological:     Mental Status: He is alert.  Psychiatric:        Mood and Affect: Mood and affect normal.     ED Results / Procedures / Treatments   Labs (all labs ordered are listed, but only abnormal results are displayed) Labs Reviewed  BASIC METABOLIC PANEL - Abnormal; Notable for the following components:      Result Value   Potassium 3.2 (*)    Glucose, Bld 107 (*)    BUN 42 (*)    Creatinine, Ser 1.26 (*)    Calcium 8.5 (*)    All other components within normal limits  I-STAT CHEM 8, ED - Abnormal; Notable for the following components:   Potassium 3.3 (*)    BUN 50 (*)    Glucose, Bld 102 (*)    Calcium, Ion 1.09 (*)    All other components within normal limits  CBC  TROPONIN I (HIGH SENSITIVITY)    EKG EKG Interpretation  Date/Time:  Friday May 26 2020 08:02:11 EST Ventricular Rate:  67 PR Interval:    QRS Duration: 98 QT Interval:  419 QTC Calculation: 443 R Axis:   49 Text Interpretation: Sinus rhythm Normal ECG Confirmed by [id number] (Gwyneth Sprout) on 05/26/2020  8:10:09 AM   Radiology DG Chest Portable 1 View  Result Date: 05/25/2020 CLINICAL DATA:  Shortness of breath COVID positive EXAM: PORTABLE CHEST 1 VIEW COMPARISON:  10/12/2013 FINDINGS: Low lung volumes. Patchy perihilar and basilar airspace disease. No pleural effusion. Normal heart size. No pneumothorax. IMPRESSION: Low lung volumes with patchy perihilar and basilar airspace disease, consistent with bilateral pneumonia Electronically Signed   By: 12/12/2013 M.D.   On: 05/25/2020 17:40  Procedures Procedures (including critical care time)  Medications Ordered in ED Medications  iohexol (OMNIPAQUE) 350 MG/ML injection 100 mL (70 mLs Intravenous Contrast Given 05/26/20 1002)    ED Course  I have reviewed the triage vital signs and the nursing notes.  Pertinent labs & imaging results that were available during my care of the patient were reviewed by me and considered in my medical decision making (see chart for details).  Patient seen and examined. Work-up initiated.  Troponin normal.  CT angio ordered.  Will also have patient ambulate in the room as his oxygen level was between 92 to 94% on room air.  Vital signs reviewed and are as follows: BP 126/81 (BP Location: Right Arm)    Pulse 72    Temp 98.3 F (36.8 C) (Oral)    Resp (!) 23    Ht 5\' 10"  (1.778 m)    Wt 132 kg    SpO2 93%    BMI 41.75 kg/m   10:26 AM CT imaging reviewed personally.  No reported evidence of PE.  Patient with multifocal pneumonia consistent with known Covid 19.  He has maintained pulse ox with ambulation at 95%.  We discussed results.  At this point, I am comfortable with discharge to home with symptom control.  Patient does have a pulse oximeter and he will monitor his oxygen saturations and return with persistent saturations less than 90%.  Encourage close PCP follow-up as needed as he recovers.  Patient urged to return with worsening symptoms or other concerns. Patient verbalized understanding and  agrees with plan.     MDM Rules/Calculators/A&P                          Patient with Covid pneumonia, here for evaluation of worsening pleuritic chest pain.  Patient was evaluated for pulmonary embolism and was negative.  CT demonstrates multifocal pneumonia.  Patient is not hypoxic and does not have a new oxygen requirement.  Plan for discharge home with continued supportive treatment.  Final Clinical Impression(s) / ED Diagnoses Final diagnoses:  Pleuritic chest pain  Pneumonia due to COVID-19 virus    Rx / DC Orders ED Discharge Orders         Ordered    benzonatate (TESSALON) 100 MG capsule  Every 8 hours        05/26/20 1039           05/28/20, PA-C 05/26/20 1043    05/28/20, MD 05/26/20 1324

## 2020-05-26 NOTE — Discharge Instructions (Signed)
Please read and follow all provided instructions.  Your diagnoses today include:  1. Pleuritic chest pain   2. Pneumonia due to COVID-19 virus     Tests performed today include:  Blood counts and electrolytes  EKG and troponin -no signs of stress or inflammation of the heart  CT scan of the chest -shows extensive pneumonia without signs of blood clot  Vital signs. See below for your results today.   Medications prescribed:   Tessalon Perles - cough suppressant medication   Please use over-the-counter NSAID medications (ibuprofen, naproxen) as directed on the packaging for pain if you do not have any reasons not to take these medications just as weak kidneys or a history of bleeding in your stomach or gut.   Take any prescribed medications only as directed.  Home care instructions:  Follow any educational materials contained in this packet.  Use incentive spirometer 10 times per hour while awake to help keep your lungs as open as possible.  Monitor your oxygen level with a home pulse ox.  If your reading is persistently less than 90%, you should call your doctor or return to the emergency department.  BE VERY CAREFUL not to take multiple medicines containing Tylenol (also called acetaminophen). Doing so can lead to an overdose which can damage your liver and cause liver failure and possibly death.   Follow-up instructions: Please follow-up with the post Covid care clinic or your doctor as listed.   Return instructions:   Please return to the Emergency Department if you experience worsening symptoms.   Return with worsening shortness of breath or trouble breathing, severe chest pain, persistent vomiting.  Please return if you have any other emergent concerns.  Additional Information:  Your vital signs today were: BP 114/69    Pulse 74    Temp 98.7 F (37.1 C) (Oral)    Resp 20    Ht 5\' 10"  (1.778 m)    Wt 132 kg    SpO2 94%    BMI 41.75 kg/m  If your blood pressure  (BP) was elevated above 135/85 this visit, please have this repeated by your doctor within one month. --------------

## 2021-12-24 IMAGING — CT CT ANGIO CHEST
2 of 6 series · 18 of 46 positions shown · IV contrast (omnipaque)
Comparison: None.

CLINICAL DATA: 49-year-old male with a history of COVID positive,
shortness of breath, possible pulmonary embolism

EXAM:
CT ANGIOGRAPHY CHEST WITH CONTRAST
TECHNIQUE: Multidetector CT imaging of the chest was performed using the
standard protocol during bolus administration of intravenous
contrast. Multiplanar CT image reconstructions and MIPs were
obtained to evaluate the vascular anatomy.
CONTRAST:  70mL OMNIPAQUE IOHEXOL 350 MG/ML SOLN

[Series 6: thins · axial · 0.78mm/px · z∈[+1243,+1502]mm · 15 of 285 slices shown]
[im 13/285  lung]
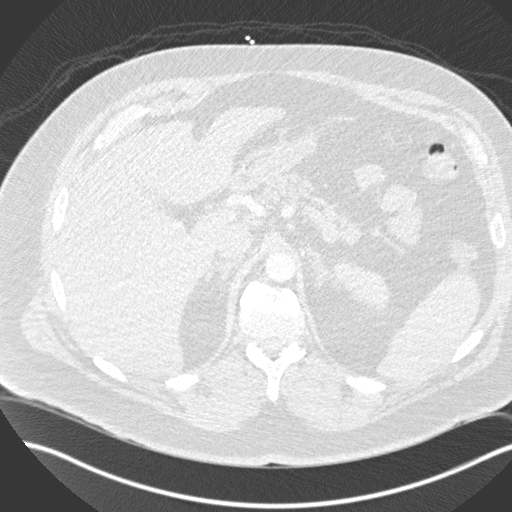
[im 38/285  soft-tissue]
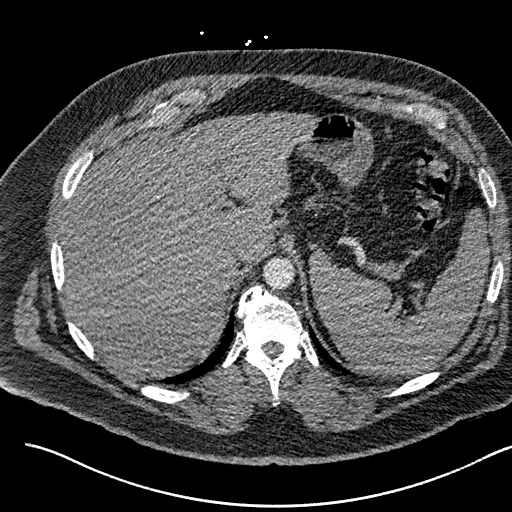
[im 50/285  lung]
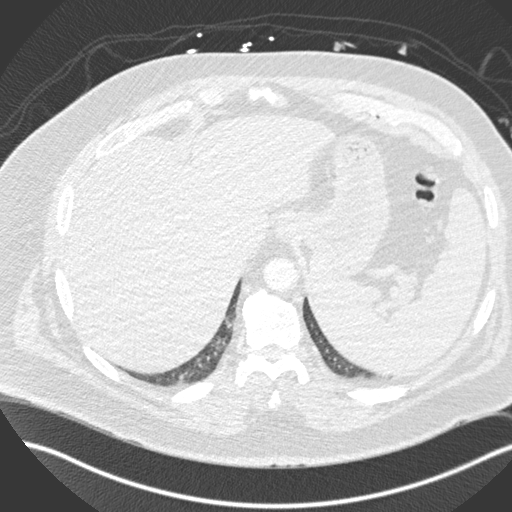
[im 75/285  soft-tissue]
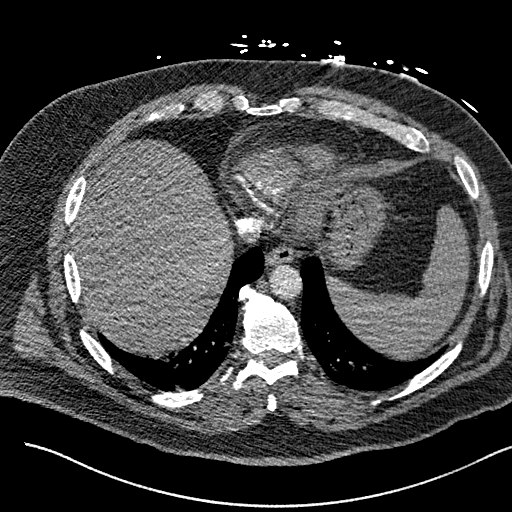
[im 87/285  lung]
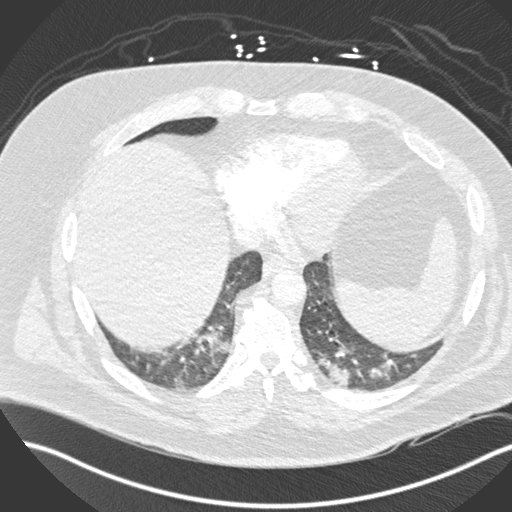
[im 112/285  soft-tissue]
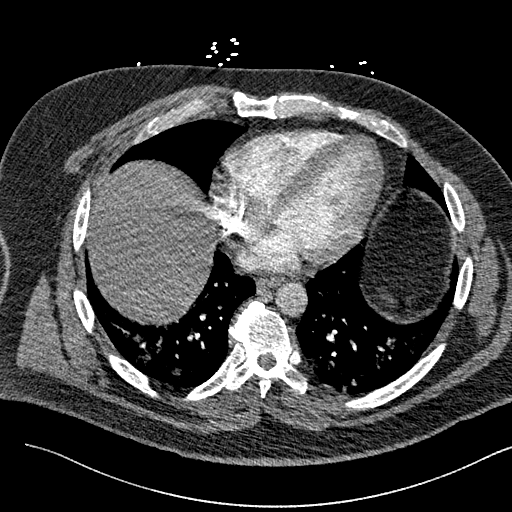
[im 124/285  lung]
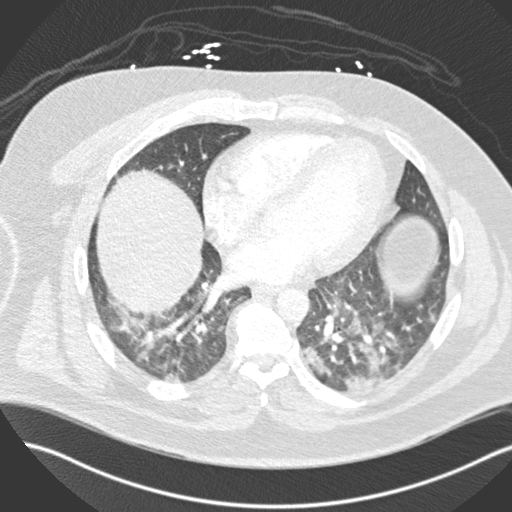
[im 149/285  soft-tissue]
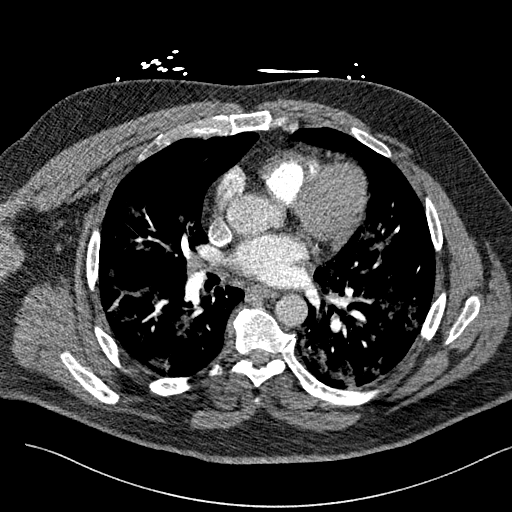
[im 161/285  lung]
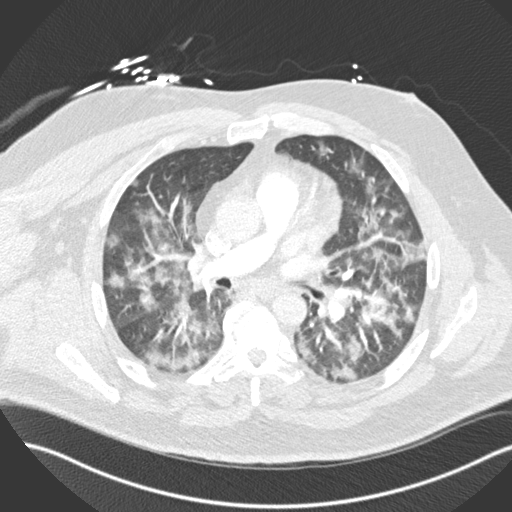
[im 173/285  soft-tissue]
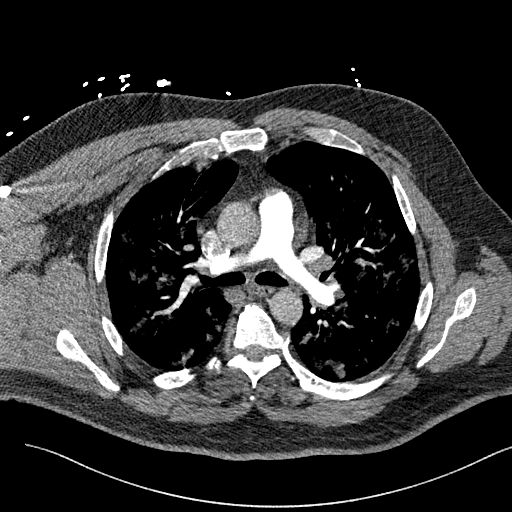
[im 198/285  lung]
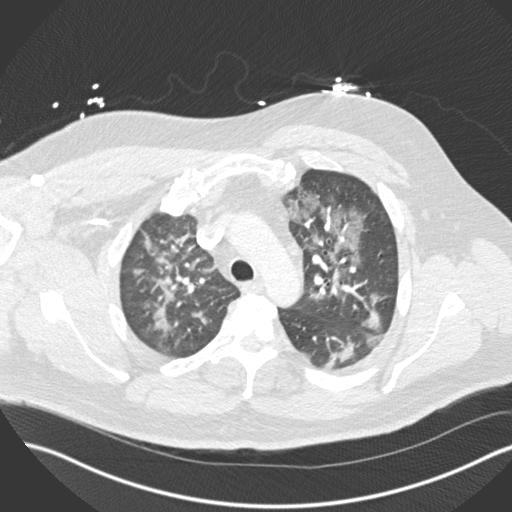
[im 210/285  soft-tissue]
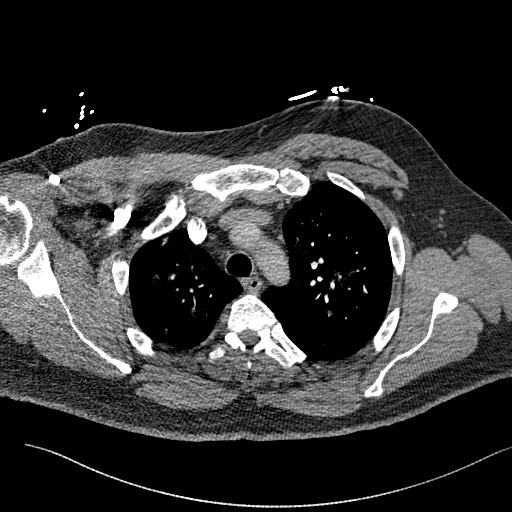
[im 235/285  lung]
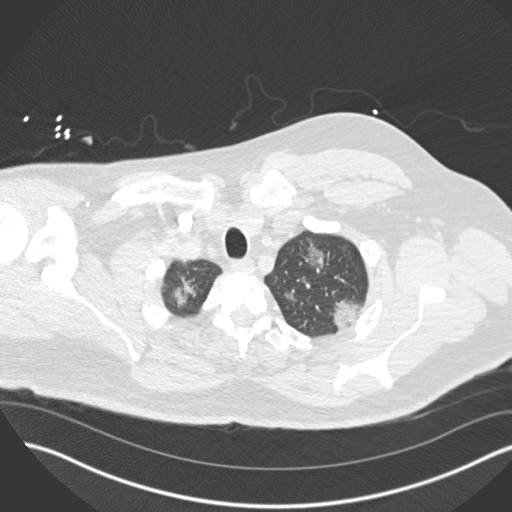
[im 247/285  soft-tissue]
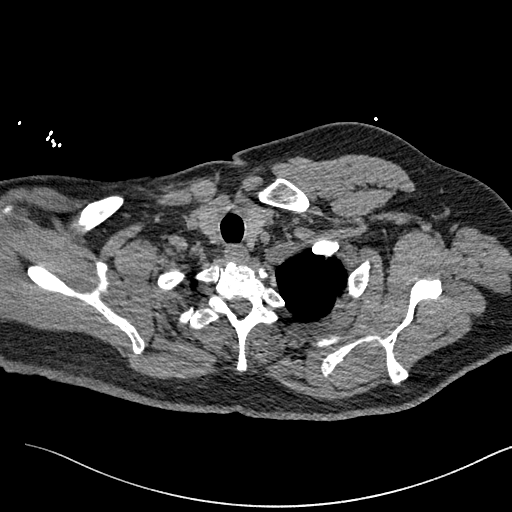
[im 272/285  lung]
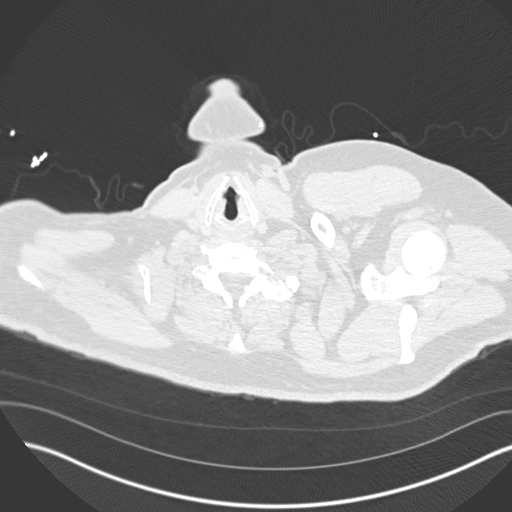

[Series 8: coronal mpr · coronal · 0.59mm/px · 3 of 154 slices shown]
[im 39/154  soft-tissue]
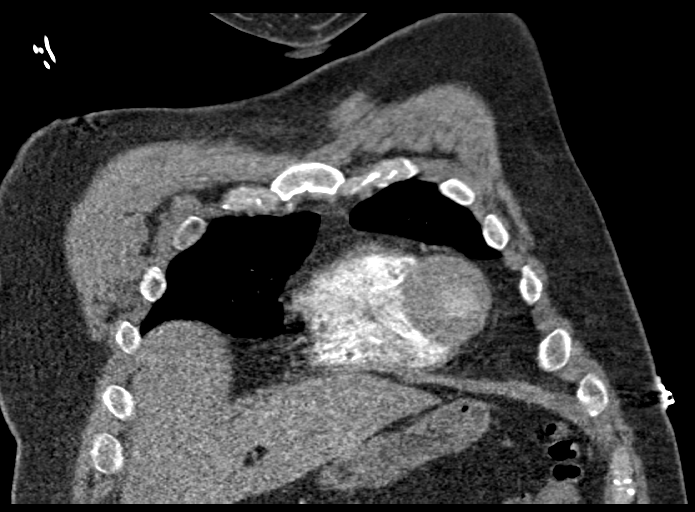
[im 77/154  soft-tissue]
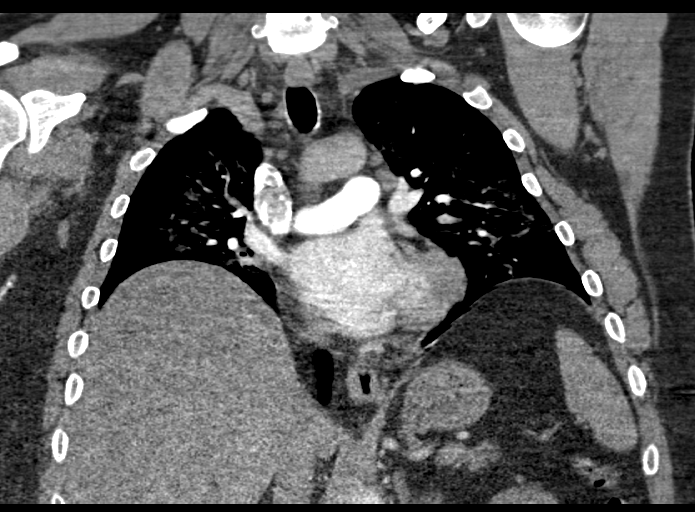
[im 115/154  soft-tissue]
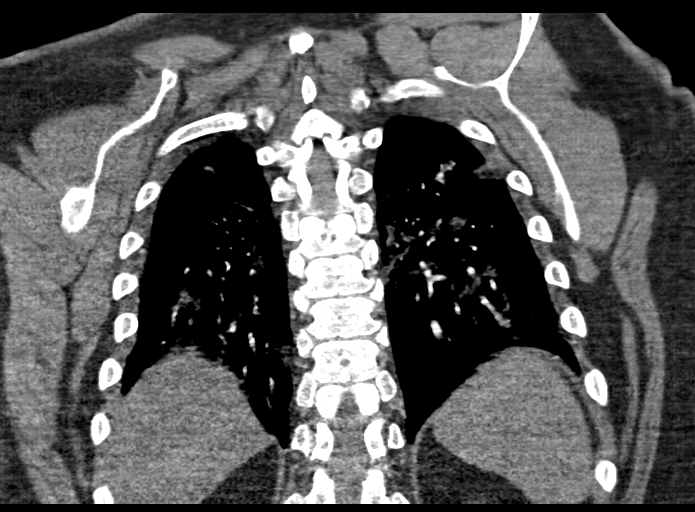

[18 of 46 positions shown; findings below may reference images not displayed]

FINDINGS: Cardiovascular:

Heart:

No cardiomegaly. No pericardial fluid/thickening. No significant
coronary calcifications.

Aorta:

Unremarkable course, caliber, contour of the thoracic aorta. No
aneurysm or dissection flap. No periaortic fluid.

Pulmonary arteries:

No central, lobar, segmental, or proximal subsegmental filling
defects.

Mediastinum/Nodes: Multiple borderline enlarged lymph nodes of the
mediastinum, likely reactive. Unremarkable appearance of the
thoracic esophagus.

Unremarkable appearance of the thoracic inlet and thyroid.

Lungs/Pleura: Patchy ground-glass nodules and more confluent nodules
throughout all lobes of the lung, distributed in a
peribronchovascular and centrilobular. No pneumothorax or pleural
effusion. No endobronchial debris.

Upper Abdomen: No acute.

Musculoskeletal: No acute displaced fracture. Degenerative changes
of the spine.

Review of the MIP images confirms the above findings.
IMPRESSION: Negative for pulmonary embolism.

Changes of COVID pneumonia/atypical pneumonia, with mixed nodular
and ground-glass/nodular foci throughout all lobes of the lungs.
Reactive mediastinal lymph nodes.

## 2022-02-04 ENCOUNTER — Encounter: Payer: Self-pay | Admitting: Cardiology

## 2022-02-04 ENCOUNTER — Ambulatory Visit: Payer: BLUE CROSS/BLUE SHIELD | Attending: Cardiology | Admitting: Cardiology

## 2022-02-04 VITALS — BP 158/88 | HR 71 | Ht 70.0 in | Wt 286.2 lb

## 2022-02-04 DIAGNOSIS — R002 Palpitations: Secondary | ICD-10-CM | POA: Diagnosis not present

## 2022-02-04 DIAGNOSIS — I4729 Other ventricular tachycardia: Secondary | ICD-10-CM

## 2022-02-04 NOTE — Progress Notes (Signed)
Primary Care Provider: Leonard Downing, MD [facility] Cardiologist: Glenetta Hew, MD Electrophysiologist: None  Clinic Note: Chief Complaint  Patient presents with   New Patient (Initial Visit)    Not sure if he needed to follow-up.  Has been many years since last visit.   ===================================  ASSESSMENT/PLAN   Problem List Items Addressed This Visit       Cardiology Problems   NSVT (nonsustained ventricular tachycardia) ([place])    He only has short little bursts of fast heart rates which are not associated really too much in the way of any symptoms besides and being somewhat anxious.  I doubt he is having any significant sustained VT. Myoview was negative.  Not having any anginal symptoms.      Relevant Orders   EKG 12-Lead (Completed)     Other   Heart palpitations - Primary    Monitor did show frequent PACs and PVCs with intermittent runs of short PAT and NSVT.  She he still feels some of these episodes but has not really noted prolonged episodes of fast heart rates.  Not associate with dizziness or wooziness or syncope/near syncope.  No significant dyspnea with symptoms.  No chest pain.  Still reluctant to consider beta-blocker.  At this point I think we will hold off on monitor his symptoms.      Relevant Orders   EKG 12-Lead (Completed)   Morbid obesity ([place])    We talked about trying to adjust his diet, increase his exercise level.  Given concern for underlying cardiovascular risk.  We talked about whether or not he would want to go through with any further studies at this point, he did not seem to be interested.  He just wanted to be "back into the loop with cardiology."  He will try to work on weight loss.  He had been down quite a bit and has gained a lot in the last several years.      Monitor for change in symptoms.  Recommend PCP follow lipid panel and A1c to ensure that he is not developing obesity related symptoms with concerns for  possible metabolic syndrome.  ===================================  HPI:    [name] is a 51 y.o. male who is being seen today presumably to reinitiate cardiology involvement.  He is being seen today at the request of Leonard Downing, *.  (Referral note not available.)  [name] was last seen on November 17, 2012 for follow-up of his echocardiogram, Myoview and monitor that showed PACs and PVCs short atrial runs at a short 4-5 beat run of nonsustained VT.  Evaluation was benign.  We talked about potentially using beta-blocker, but he did not want to be on any further medications and therefore we did not start beta-blocker.  Recent Hospitalizations: None  Reviewed  CV studies:    The following studies were reviewed today: (if available, images/films reviewed: From Epic Chart or Care Everywhere) Myoview 11/20/2012: Normal nuclear study.  Mild diaphragmatic attenuation artifact.  Normal EF.  No Ischemia or Infarction. Echo 07/09/2012: EF 55 to 60%.  Mild LVH.  Normal diastolic parameters.  Normal mitral valve.  Normal study.  Interval History:   [name] returns here today wondering if he needs any further work-up.  He was having some issues with edema near the end of the day.  Also noting overall weight gain.  He is having pretty rare episodes where he may have increased heart rate spells.  These for can  be associated with some dizziness off and on.  Maybe once every 3 to 4 weeks where he will feel the heart fluttering sensation.  But not really too fast heart rates..  But not with chest pain or dyspnea.  Noted exertional dyspnea.  He has a sleep study evaluation pending.  He seems very stressed while talking to me.  He does speak with his daughter.  He has been issue having issues with chronic ulcerative colitis flares.   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  He usually does 4 to 5-hour shifts and just does not get a lot of sleep at night mostly because of stress and  anxiety.   CV Review of Symptoms (Summary): positive for - irregular heartbeat, palpitations, and rapid heart rate negative for - chest pain, dyspnea on exertion, edema, orthopnea, paroxysmal nocturnal dyspnea, shortness of breath, or syncope or near syncope, TIA/amaurosis fugax or claudication.  REVIEWED OF SYSTEMS   Review of Systems  Constitutional:  Positive for malaise/fatigue. Negative for weight loss.  Respiratory:  Positive for shortness of breath (Somewhat at baseline if he overexerts, but not routine exertional dyspnea.). Negative for cough.   Gastrointestinal:  Positive for abdominal pain. Negative for blood in stool (No recent UC flare with bleeding.) and melena.  Genitourinary:  Negative for hematuria.  Musculoskeletal:  Positive for joint pain.  Neurological:  Positive for dizziness. Negative for focal weakness and weakness.       Intermittent weakness spells.  Psychiatric/Behavioral:  Negative for depression and memory loss. The patient is nervous/anxious. The patient does not have insomnia.     I have reviewed and (if needed) personally updated the patient's problem list, medications, allergies, past medical and surgical history, social and family history.   PAST MEDICAL HISTORY   Past Medical History:  Diagnosis Date   Arthritis    Generalized anxiety disorder    GERD (gastroesophageal reflux disease)    Heart palpitations    Frequent PACs & PVC with intermittent short runs of PAT & NSVT   Morbid obesity (HCC) 02/16/2022   Nonsustained ventricular tachycardia (HCC) 10/13/2012   Noted on Monitor 5/7-6/5    Psoriasis vulgaris    Ulcerative colitis, chronic (HCC)    with duodenal ulcer    PAST SURGICAL HISTORY   Past Surgical History:  Procedure Laterality Date   DOPPLER ECHOCARDIOGRAPHY  07/09/2012   EF 55-0%, mild LVH, otherwise normal   I & D EXTREMITY  05/01/2012   Procedure: IRRIGATION AND DEBRIDEMENT EXTREMITY;  Surgeon: Dominica Severin, MD;  Location:  [facility] OR;  Service: Orthopedics;  Laterality: Left;   NERVE AND TENDON REPAIR  05/01/2012   Procedure: NERVE AND TENDON REPAIR;  Surgeon: Dominica Severin, MD;  Location: [facility] OR;  Service: Orthopedics;  Laterality: Left;     There is no immunization history on file for this patient.  MEDICATIONS/ALLERGIES   Current Meds  Medication Sig   benzonatate (TESSALON) 100 MG capsule Take 1 capsule (100 mg total) by mouth every 8 (eight) hours.   busPIRone (BUSPAR) 10 MG tablet Take 1/2 tablet bid,prn   mesalamine (LIALDA) 1.2 G EC tablet Take 1,200 mg by mouth. ,prn   omeprazole (PRILOSEC OTC) 20 MG tablet Take 20 mg by mouth daily.   Oxycodone-Acetaminophen (PERCOCET PO) Take by mouth.   Probiotic Product (PROBIOTIC DAILY PO) Take by mouth.   SKYRIZI 150 MG/ML SOSY Inject 1 Application into the skin every 3 (three) months.    No Known Allergies  SOCIAL HISTORY/FAMILY  HISTORY   Reviewed in Epic:   Social History   Tobacco Use   Smoking status: Former   Smokeless tobacco: Former    Quit date: 11/18/1986   Tobacco comments:    smoked high school  Substance Use Topics   Alcohol use: No   Social History   Social History Narrative   Not on file   Family History  Problem Relation Age of Onset   Stroke Father    Cancer Maternal Grandmother    Cancer Maternal Grandfather    Cancer Paternal Grandmother    Stroke Paternal Grandfather     OBJCTIVE -PE, EKG, labs   Wt Readings from Last 3 Encounters:  02/04/22 286 lb 3.2 oz (129.8 kg)  05/25/20 291 lb (132 kg)  03/18/15 250 lb (113.4 kg)    Physical Exam: BP (!) 158/88   Pulse 71   Ht 5\' 10"  (1.778 m)   Wt 286 lb 3.2 oz (129.8 kg)   SpO2 97%   BMI 41.07 kg/m  -> home blood pressure was 130/70 this morning.  On my recheck, blood pressure was 138/73 mmHg-indicated that he was quite stressed about being here.  He does seem very anxious. Physical Exam Vitals reviewed.  Constitutional:      General: He is not in acute  distress.    Appearance: Normal appearance. He is obese. He is not ill-appearing or toxic-appearing.     Comments: Morbid obese, somewhat anxious.  Well-groomed.  Well-nourished.  HENT:     Head: Normocephalic and atraumatic.  Eyes:     Extraocular Movements: Extraocular movements intact.  Cardiovascular:     Rate and Rhythm: Normal rate and regular rhythm.     Pulses: Normal pulses.     Heart sounds: Normal heart sounds. No murmur heard.    No friction rub. No gallop.  Pulmonary:     Effort: Pulmonary effort is normal. No respiratory distress.     Breath sounds: Normal breath sounds.     Comments: Distant breath sounds but no wheezes rales or rhonchi. Abdominal:     General: Abdomen is flat. Bowel sounds are normal. There is no distension.     Palpations: Abdomen is soft.     Tenderness: There is no abdominal tenderness. There is no guarding.     Hernia: No hernia is present.     Comments: BS, NT/ND NABS.  Neurological:     General: No focal deficit present.     Mental Status: He is alert and oriented to person, place, and time.     Gait: Gait normal.     Comments: He speaks with a stutter.  Psychiatric:        Mood and Affect: Mood normal.        Behavior: Behavior normal.        Thought Content: Thought content normal.        Judgment: Judgment normal.     Comments: Seems very anxious,     Adult ECG Report  Rate: 71 ;  Rhythm: normal sinus rhythm and normal axis, intervals and durations.  Borderline low voltage.  Relatively normal with mild nonspecific changes. ;   Narrative Interpretation: Stable  Recent Labs: No recent labs. No results found for: "CHOL", "HDL", "LDLCALC", "LDLDIRECT", "TRIG", "CHOLHDL" Lab Results  Component Value Date   CREATININE 1.20 05/26/2020   BUN 50 (H) 05/26/2020   NA 138 05/26/2020   K 3.3 (L) 05/26/2020   CL 103 05/26/2020   CO2 23 05/26/2020  Latest Ref Rng & Units 05/26/2020    8:41 AM 05/26/2020    8:33 AM 05/02/2012    2:37  AM  CBC  WBC 4.0 - 10.5 K/uL  5.6  9.3   Hemoglobin 13.0 - 17.0 g/dL 15.6  15.9  14.4   Hematocrit 39.0 - 52.0 % 46.0  47.7  41.6   Platelets 150 - 400 K/uL  227  250     No results found for: "HGBA1C" No results found for: "TSH"  ================================================== I spent a total of 33 minutes with the patient spent in direct patient consultation.  Additional time spent with chart review  / charting (studies, outside notes, etc): 16 min Total Time: 49 min  Current medicines are reviewed at length with the patient today.  (+/- concerns) n/a  Notice: This dictation was prepared with Dragon dictation along with smart phrase technology. Any transcriptional errors that result from this process are unintentional and may not be corrected upon review.   Studies Ordered:  Orders Placed This Encounter  Procedures   EKG 12-Lead   No orders of the defined types were placed in this encounter.   Patient Instructions / Medication Changes & Studies & Tests Ordered   Patient Instructions  Medication Instructions:  No changes   *If you need a refill on your cardiac medications before your next appointment, please call your pharmacy*   Lab Work: Not needed  Testing/Procedures:  Not needed  Follow-Up: At [facility], you and your health needs are our priority.  As part of our continuing mission to provide you with exceptional heart care, we have created designated Provider Care Teams.  These Care Teams include your primary Cardiologist (physician) and Advanced Practice Providers (APPs -  Physician Assistants and Nurse Practitioners) who all work together to provide you with the care you need, when you need it.  We recommend signing up for the patient portal called "MyChart".  Sign up information is provided on this After Visit Summary.  MyChart is used to connect with patients for Virtual Visits (Telemedicine).  Patients are able to view lab/test results, encounter  notes, upcoming appointments, etc.  Non-urgent messages can be sent to your provider as well.   To learn more about what you can do with MyChart, go to [email or web address].    Your next appointment:   12 month(s)  The format for your next appointment:   In Person  Provider:   Glenetta Hew, MD    Other Instructions  Your physician encouraged you to lose weight for better health.     Leonie Man, MD, MS Glenetta Hew, M.D., M.S. Interventional Cardiologist  [place]  Pager # [phone] Phone # [phone] [address] [place], [place] [address]   Thank you for choosing [facility]!!

## 2022-02-04 NOTE — Patient Instructions (Addendum)
Medication Instructions:  No changes   *If you need a refill on your cardiac medications before your next appointment, please call your pharmacy*   Lab Work: Not needed  Testing/Procedures:  Not needed  Follow-Up: At [facility], you and your health needs are our priority.  As part of our continuing mission to provide you with exceptional heart care, we have created designated Provider Care Teams.  These Care Teams include your primary Cardiologist (physician) and Advanced Practice Providers (APPs -  Physician Assistants and Nurse Practitioners) who all work together to provide you with the care you need, when you need it.  We recommend signing up for the patient portal called "MyChart".  Sign up information is provided on this After Visit Summary.  MyChart is used to connect with patients for Virtual Visits (Telemedicine).  Patients are able to view lab/test results, encounter notes, upcoming appointments, etc.  Non-urgent messages can be sent to your provider as well.   To learn more about what you can do with MyChart, go to [email or web address].    Your next appointment:   12 month(s)  The format for your next appointment:   In Person  Provider:   Bryan Lemma, MD    Other Instructions  Your physician encouraged you to lose weight for better health.

## 2022-02-16 ENCOUNTER — Encounter: Payer: Self-pay | Admitting: Cardiology

## 2022-02-16 HISTORY — DX: Morbid (severe) obesity due to excess calories: E66.01

## 2022-02-16 NOTE — Assessment & Plan Note (Signed)
Monitor did show frequent PACs and PVCs with intermittent runs of short PAT and NSVT.  She he still feels some of these episodes but has not really noted prolonged episodes of fast heart rates.  Not associate with dizziness or wooziness or syncope/near syncope.  No significant dyspnea with symptoms.  No chest pain.  Still reluctant to consider beta-blocker.  At this point I think we will hold off on monitor his symptoms.

## 2022-02-16 NOTE — Assessment & Plan Note (Signed)
He only has short little bursts of fast heart rates which are not associated really too much in the way of any symptoms besides and being somewhat anxious.  I doubt he is having any significant sustained VT. Myoview was negative.  Not having any anginal symptoms.

## 2022-02-16 NOTE — Assessment & Plan Note (Signed)
We talked about trying to adjust his diet, increase his exercise level.  Given concern for underlying cardiovascular risk.  We talked about whether or not he would want to go through with any further studies at this point, he did not seem to be interested.  He just wanted to be "back into the loop with cardiology."  He will try to work on weight loss.  He had been down quite a bit and has gained a lot in the last several years.

## 2022-03-13 ENCOUNTER — Encounter: Payer: Self-pay | Admitting: Cardiology

## 2022-03-13 DIAGNOSIS — R002 Palpitations: Secondary | ICD-10-CM

## 2022-03-13 DIAGNOSIS — I4729 Other ventricular tachycardia: Secondary | ICD-10-CM

## 2022-03-13 NOTE — Telephone Encounter (Signed)
I am fine with 14 d Zio patch.  Would like to see PVC burden   [facility]

## 2022-03-14 ENCOUNTER — Ambulatory Visit: Payer: BLUE CROSS/BLUE SHIELD | Attending: Cardiology

## 2022-03-14 DIAGNOSIS — R002 Palpitations: Secondary | ICD-10-CM

## 2022-03-14 DIAGNOSIS — I4729 Other ventricular tachycardia: Secondary | ICD-10-CM

## 2022-03-14 NOTE — Progress Notes (Unsigned)
Enrolled for [employer] to mail a ZIO XT long term holter monitor to the patients address on file.  

## 2022-03-14 NOTE — Telephone Encounter (Signed)
Sent patient a message by mychart with instructuions.  Ordered place for [employer]

## 2022-03-19 NOTE — Telephone Encounter (Signed)
If there is an issue with the old style Zio patch monitor, we can try the new ones that they just came up with that are probably better for someone who has significant sweating.  These are put on in the office.  Glenetta Hew, MD

## 2022-03-20 NOTE — Telephone Encounter (Signed)
If the monitor becomes a issue due to sweating will have patient to discuss with  [facility] monitor dept

## 2022-04-10 DIAGNOSIS — R002 Palpitations: Secondary | ICD-10-CM

## 2022-04-10 DIAGNOSIS — I4729 Other ventricular tachycardia: Secondary | ICD-10-CM | POA: Diagnosis not present

## 2022-05-09 NOTE — Telephone Encounter (Signed)
Just read the study.  Results review reported.  Overall pretty benign study.  Symptomatic PACs and PVCs but no arrhythmia.  Bryan Lemma, MD .

## 2022-05-11 NOTE — Telephone Encounter (Signed)
That short spell on monitor was a 4 beat run of Premature Atrial Comlexes (any run of > 3 PACs or PVCs is considered a Rhythm by convention).   Basically it is a Education officer, community.  - You may have felt it as a quick flutter. The computer calls these Supraventricular Tachycardia (SVT) Episodes which is a Catch-all phrase.  True SVT runs last minutes to hours & have a stable rate.  Short little bursts are simply called Atrial Run.    These are still considered Benign.   Prolonged SVT runs are a different entity b/c prolonged fast heart rates (tachycardia) can make you feel lightheaded/dizzy (& maybe even pass out if too fast or too long).    Basically, this monitor shows rare premature beats that occasionally come in couplets & triplets & 1 quadruplet.  All Benign findings.  DH

## 2022-05-13 NOTE — Telephone Encounter (Signed)
I think the next best option if he is still having Sx is to use home monitor - [employer] or AppleWatch (& new Samsung Watch).  These allow recording of episodes @ pt convenience without the hassle of ordering a Monitor.    DH

## 2022-06-19 ENCOUNTER — Encounter: Payer: Self-pay | Admitting: Cardiology

## 2022-06-19 NOTE — Telephone Encounter (Signed)
See other note

## 2022-06-19 NOTE — Telephone Encounter (Signed)
Hard to get Insurance to cover a 2nd monitor ` months from last 1 -- would recommend [place] monitor (or if you have a newer model Apple Watch or Samsung smart watch - usually these have a rhythm strip app option.  Lets try Lopressor 25 mg BID - disp 60 mg 6 refills --> need to schedule f/u with me or app after ~ 1 month -- too hard to figure out what he is meaning / asking   Glenetta Hew, MD

## 2022-06-20 MED ORDER — METOPROLOL TARTRATE 25 MG PO TABS: 25.0000 mg | ORAL_TABLET | Freq: Two times a day (BID) | ORAL | 6 refills | Status: DC

## 2022-06-20 NOTE — Telephone Encounter (Signed)
Spoke to patient he cannot talk at present.He will call office back tomorrow to discuss email he sent earlier.

## 2022-06-20 NOTE — Addendum Note (Signed)
Addended by: Betha Loa F on: 06/20/2022 08:18 AM   Modules accepted: Orders

## 2022-06-20 NOTE — Addendum Note (Signed)
Addended by: Rasean Joos F on: 06/20/2022 08:18 AM   Modules accepted: Orders  

## 2022-07-10 ENCOUNTER — Encounter: Payer: Self-pay | Admitting: Cardiology

## 2022-07-10 NOTE — Telephone Encounter (Signed)
Patient stated he currently prescribed clonidine want to make sure if that's okay. However, the patient stated his blood pressure is elevated 165/95 , pulse 75 he did take his blood pressure while I was on the phone 166/101, 73 p. Asked patient if he was taking his blood pressure medications, he never did pick up the medication from the pharmacy. He complained of ringing in his ears. Denies swelling in the extremities, shortness of breath and chest pain. Advised patient to pick up his medication from the pharmacy and take as prescribed. Also, to monitor his blood pressures at home. Once again the patient wanted to make sure he can take his clonidine, advised patient MD will have to make that discission. Patient voiced understanding. Will forward to MD and RN

## 2022-07-11 NOTE — Telephone Encounter (Signed)
Agree with trying to cut the metoprolol in half.  Reduce to 1/2 tablet twice daily.  Not a huge fan of clonidine for standing medication.  I think we can look at blood pressure medications and follow-up visit.   Glenetta Hew, MD

## 2022-07-17 NOTE — Telephone Encounter (Signed)
Try taking it only at night for 1 week, then add the AM dose -- if still too tired, we can just stop metoprolol - but will need to reassess BP  Glenetta Hew c

## 2022-07-22 ENCOUNTER — Ambulatory Visit: Payer: BLUE CROSS/BLUE SHIELD | Admitting: Cardiology

## 2022-07-31 NOTE — Progress Notes (Unsigned)
Cardiology Clinic Note   Patient Name: [name] Date of Encounter: 08/01/2022  Primary Care Provider:  Leonard Downing, MD Primary Cardiologist:  Glenetta Hew, MD  Patient Profile    [name] 52 year old male presents the clinic today for follow-up evaluation of his NSVT.  Past Medical History    Past Medical History:  Diagnosis Date   Arthritis    Generalized anxiety disorder    GERD (gastroesophageal reflux disease)    Heart palpitations    Frequent PACs & PVC with intermittent short runs of PAT & NSVT   Morbid obesity ([place]) 02/16/2022   Nonsustained ventricular tachycardia ([place]) 10/13/2012   Noted on Monitor 5/7-6/5    Psoriasis vulgaris    Ulcerative colitis, chronic ([place])    with duodenal ulcer   Past Surgical History:  Procedure Laterality Date   DOPPLER ECHOCARDIOGRAPHY  07/09/2012   EF 55-0%, mild LVH, otherwise normal   I & D EXTREMITY  05/01/2012   Procedure: IRRIGATION AND DEBRIDEMENT EXTREMITY;  Surgeon: Roseanne Kaufman, MD;  Location: [facility];  Service: Orthopedics;  Laterality: Left;   NERVE AND TENDON REPAIR  05/01/2012   Procedure: NERVE AND TENDON REPAIR;  Surgeon: Roseanne Kaufman, MD;  Location: [facility];  Service: Orthopedics;  Laterality: Left;    Allergies  No Known Allergies  History of Present Illness    [name] has a PMH of palpitations, NSVT, morbid obesity, and essential hypertension.  Nuclear stress test 11/21/2022 was normal.  He was noted to have mild diaphragmatic attenuation, no ischemia or infarct.  Echocardiogram 07/09/2012 showed an EF of 55-60%, mild LVH, normal diastolic parameters, normal mitral valve function.  He was seen in follow-up by Dr. Ellyn Hack on 02/04/2022.  He was noted to have some lower extremity swelling towards the end of the day.  He also noted some weight gain.  He reported rare episodes of increased heart rate.  These were associated with dizziness on and off.  He noted once every 3 to 4 weeks and described  his sensation as a fluttering type feeling.  He denies chest pain and dyspnea.  He noted exertional dyspnea.  He had a pending sleep study.  He reported chronic ulcerative colitis flares.  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  He reported not getting a lot of sleep at night related to stress and anxiety.  He wore a cardiac event monitor 04/29/2022.  He was noted to have no sustained arrhythmias.  Rare less than 1% PACs and PVCs, 1 brief atrial run of 4 beats.  He presents to the clinic today for follow-up evaluation and states he is able to tolerate metoprolol somewhat with taking half a pill at night.  We reviewed his cardiac event monitor and he expressed understanding.  His palpitations are multifactorial and can be attributed to in part anxiety/stress.  We reviewed the importance of weight management and increase physical activity.  I reviewed triggers for palpitations.  He expressed understanding.  I will give him a high-fiber diet instructions, triggers for palpitations, referral to ENT for evaluation of ringing in the ears and plan follow-up in 6 months.  Today he denies chest pain, shortness of breath, lower extremity edema, palpitations, melena, hematuria, hemoptysis, diaphoresis,  presyncope, and syncope.    Home Medications    Prior to Admission medications   Medication Sig Start Date End Date Taking? Authorizing Provider  benzonatate (TESSALON) 100 MG capsule Take 1  capsule (100 mg total) by mouth every 8 (eight) hours. 05/26/20   Carlisle Cater, PA-C  busPIRone (BUSPAR) 10 MG tablet Take 1/2 tablet bid,prn    [provider]  mesalamine (LIALDA) 1.2 G EC tablet Take 1,200 mg by mouth. ,prn    [provider]  metoprolol tartrate (LOPRESSOR) 25 MG tablet Take 1 tablet (25 mg total) by mouth 2 (two) times daily. 06/20/22   Leonie Man, MD  omeprazole (PRILOSEC OTC) 20 MG tablet Take 20 mg by mouth daily.    [provider]  Oxycodone-Acetaminophen (PERCOCET PO) Take by mouth.    [provider]  Probiotic Product (PROBIOTIC DAILY PO) Take by mouth.    [provider]  SKYRIZI 150 MG/ML SOSY Inject 1 Application into the skin every 3 (three) months. 12/10/21   [provider]    Family History    Family History  Problem Relation Age of Onset   Stroke Father    Cancer Maternal Grandmother    Cancer Maternal Grandfather    Cancer Paternal Grandmother    Stroke Paternal Grandfather    He indicated that his mother is alive. He indicated that his father is alive. He indicated that his brother is alive. He indicated that his maternal grandmother is deceased. He indicated that his maternal grandfather is deceased. He indicated that his paternal grandmother is deceased. He indicated that his paternal grandfather is deceased.  Social History    Social History   Socioeconomic History   Marital status: Married    Spouse name: Not on file   Number of children: Not on file   Years of education: Not on file   Highest education level: Not on file  Occupational History   Not on file  Tobacco Use   Smoking status: Former   Smokeless tobacco: Former    Quit date: 11/18/1986   Tobacco comments:    smoked high school  Substance and Sexual Activity   Alcohol use: No   Drug use: Not on file   Sexual activity: Not on file  Other Topics Concern   Not on file  Social History Narrative   Not on file   Social Determinants of Health   Financial Resource Strain: Not on file  Food Insecurity: Not on file  Transportation Needs: Not on file  Physical Activity: Not on file  Stress: Not on file  Social Connections: Not on file  Intimate Partner Violence: Not on file     Review of Systems    General:  No chills, fever, night sweats or weight changes.  Cardiovascular:  No chest pain, dyspnea on exertion, edema, orthopnea, palpitations, paroxysmal nocturnal  dyspnea. Dermatological: No rash, lesions/masses Respiratory: No cough, dyspnea Urologic: No hematuria, dysuria Abdominal:   No nausea, vomiting, diarrhea, bright red blood per rectum, melena, or hematemesis Neurologic:  No visual changes, wkns, changes in mental status. All other systems reviewed and are otherwise negative except as noted above.  Physical Exam    VS:  BP 138/78   Pulse 87   Ht 5' 10"$  (1.778 m)   Wt 291 lb (132 kg)   BMI 41.75 kg/m  , BMI Body mass index is 41.75 kg/m. GEN: Well nourished, well developed, in no acute distress. HEENT: normal. Neck: Supple, no JVD, carotid bruits, or masses. Cardiac: RRR, no murmurs, rubs, or gallops. No clubbing, cyanosis, edema.  Radials/DP/PT 2+ and equal bilaterally.  Respiratory:  Respirations regular and unlabored, clear to auscultation bilaterally. GI: Soft,  nontender, nondistended, BS + x 4. MS: no deformity or atrophy. Skin: warm and dry, no rash. Neuro:  Strength and sensation are intact. Psych: Normal affect.  Accessory Clinical Findings    Recent Labs: No results found for requested labs within last 365 days.   Recent Lipid Panel No results found for: "CHOL", "TRIG", "HDL", "CHOLHDL", "VLDL", "LDLCALC", "LDLDIRECT"       ECG personally reviewed by me today-none today.  Cardiac event monitor 04/29/2022    ZioPatch Wear Time:  7 days and 4 hours (2023-11-01T18:53:08-398 to 2023-11-08T22:48:28-0500)   Predominant Underlying Rhythm: Sinus rhythm: HR range 48-130 bpm, Avg 78 bpm   Rare  (<1 %) isolated premature atrial contractions PACs and PVCs noted, with rare couplets   1 brief atrial run of 4 beats (100-119 bpm with an average 112 bpm lasting 2.3 seconds) - noted on patient monitor   Patient Triggers: Sinus Rhythm (+/- Tachycardia) with PACs, PVCs   No Sustained Arrhythmias: Atrial Tachycardia (AT), Supraventricular Tachycardia (SVT), Atrial Fibrillation (A-Fib), Atrial Flutter (A-Flutter), Sustained  Ventricular Tachycardia (VT)     Relatively benign study.  Symptoms mostly noted with isolated PACs or PVCs.  1 short PAT run. No sustained arrhythmias. Would recommend expectant/nonmedical management.  Reassuring findings.       Glenetta Hew, MD  Assessment & Plan   1.  Palpitations-notes intermittent periods of palpitations.  Have been present for several years.  Cardiac event monitor 04/29/2022 showed rare PACs and PVCs and 1 brief atrial run of 4 beats. Continue metoprolol Heart healthy low-sodium diet Increase physical activity as tolerated Avoid triggers caffeine, chocolate, EtOH, dehydration etc.  Essential hypertension-blood pressure today 138/78.  Notes blood pressure at home to be around 130's/70-80 Continue metoprolol Heart healthy low-sodium diet-salty 6 given Increase physical activity as tolerated Maintain blood pressure log  Anxiety/stress-notes anxiety and stress are impacting his sleep. Mindfulness stress reduction sheet given Follows with PCP  Ringing in ears-reports over the last month he is had ringing in bilateral ears. Refer to ENT  Disposition: Follow-up with Dr. Ellyn Hack or me in 6 months.   Russell Ng. Riyanna Crutchley NP-C     08/01/2022, 2:46 PM [place] [address] Office [phone] Fax [phone]    I spent 14 minutes examining this patient, reviewing medications, and using patient centered shared decision making involving her cardiac care.  Prior to her visit I spent greater than 20 minutes reviewing her past medical history,  medications, and prior cardiac tests.

## 2022-08-01 ENCOUNTER — Ambulatory Visit: Payer: BLUE CROSS/BLUE SHIELD | Attending: Cardiology | Admitting: General Practice

## 2022-08-01 ENCOUNTER — Encounter: Payer: Self-pay | Admitting: General Practice

## 2022-08-01 VITALS — BP 138/78 | HR 87 | Ht 70.0 in | Wt 291.0 lb

## 2022-08-01 DIAGNOSIS — F419 Anxiety disorder, unspecified: Secondary | ICD-10-CM | POA: Diagnosis not present

## 2022-08-01 DIAGNOSIS — H9313 Tinnitus, bilateral: Secondary | ICD-10-CM

## 2022-08-01 DIAGNOSIS — I4729 Other ventricular tachycardia: Secondary | ICD-10-CM

## 2022-08-01 DIAGNOSIS — I10 Essential (primary) hypertension: Secondary | ICD-10-CM

## 2022-08-01 DIAGNOSIS — R002 Palpitations: Secondary | ICD-10-CM

## 2022-08-01 NOTE — Patient Instructions (Signed)
Medication Instructions:  CONTINUE YOUR METOPROLOL *If you need a refill on your cardiac medications before your next appointment, please call your pharmacy*  Lab Work: NONE If you have labs (blood work) drawn today and your tests are completely normal, you will receive your results only by: [place] (if you have MyChart) OR A paper copy in the mail If you have any lab test that is abnormal or we need to change your treatment, we will call you to review the results.  Testing/Procedures: REFER TO ENT FOR RINGING IN EARS  Follow-Up: At [facility], you and your health needs are our priority.  As part of our continuing mission to provide you with exceptional heart care, we have created designated Provider Care Teams.  These Care Teams include your primary Cardiologist (physician) and Advanced Practice Providers (APPs -  Physician Assistants and Nurse Practitioners) who all work together to provide you with the care you need, when you need it.  Your next appointment:   6 month(s)  Provider:   Glenetta Hew, MD     Other Instructions INCREASE PHYSICAL ACTIVITY AS TOLERATED  INCREASE HYDRATION  Please try to avoid these triggers: Do not use any products that have nicotine or tobacco in them. These include cigarettes, e-cigarettes, and chewing tobacco. If you need help quitting, ask your doctor. Eat heart-healthy foods. Talk with your doctor about the right eating plan for you. Exercise regularly as told by your doctor. Stay hydrated Do not drink alcohol, Caffeine or chocolate. Lose weight if you are overweight. Do not use drugs, including cannabis    High-Fiber Eating Plan Fiber, also called dietary fiber, is a type of carbohydrate. It is found foods such as fruits, vegetables, whole grains, and beans. A high-fiber diet can have many health benefits. Your health care provider may recommend a high-fiber diet to help: Prevent constipation. Fiber can make your bowel  movements more regular. Lower your cholesterol. Relieve the following conditions: Inflammation of veins in the anus (hemorrhoids). Inflammation of specific areas of the digestive tract (uncomplicated diverticulosis). A problem of the large intestine, also called the colon, that sometimes causes pain and diarrhea (irritable bowel syndrome, or IBS). Prevent overeating as part of a weight-loss plan. Prevent heart disease, type 2 diabetes, and certain cancers. What are tips for following this plan? Reading food labels  Check the nutrition facts label on food products for the amount of dietary fiber. Choose foods that have 5 grams of fiber or more per serving. The goals for recommended daily fiber intake include: Men (age 32 or younger): 34-38 g. Men (over age 60): 28-34 g. Women (age 89 or younger): 25-28 g. Women (over age 50): 22-25 g. Your daily fiber goal is _____________ g. Shopping Choose whole fruits and vegetables instead of processed forms, such as apple juice or applesauce. Choose a wide variety of high-fiber foods such as avocados, lentils, oats, and kidney beans. Read the nutrition facts label of the foods you choose. Be aware of foods with added fiber. These foods often have high sugar and sodium amounts per serving. Cooking Use whole-grain flour for baking and cooking. Cook with brown rice instead of white rice. Meal planning Start the day with a breakfast that is high in fiber, such as a cereal that contains 5 g of fiber or more per serving. Eat breads and cereals that are made with whole-grain flour instead of refined flour or white flour. Eat brown rice, bulgur wheat, or millet instead of white rice. Use  beans in place of meat in soups, salads, and pasta dishes. Be sure that half of the grains you eat each day are whole grains. General information You can get the recommended daily intake of dietary fiber by: Eating a variety of fruits, vegetables, grains, nuts, and  beans. Taking a fiber supplement if you are not able to take in enough fiber in your diet. It is better to get fiber through food than from a supplement. Gradually increase how much fiber you consume. If you increase your intake of dietary fiber too quickly, you may have bloating, cramping, or gas. Drink plenty of water to help you digest fiber. Choose high-fiber snacks, such as berries, raw vegetables, nuts, and popcorn. What foods should I eat? Fruits Berries. Pears. Apples. Oranges. Avocado. Prunes and raisins. Dried figs. Vegetables Sweet potatoes. Spinach. Kale. Artichokes. Cabbage. Broccoli. Cauliflower. Green peas. Carrots. Squash. Grains Whole-grain breads. Multigrain cereal. Oats and oatmeal. Brown rice. Barley. Bulgur wheat. [place]. Quinoa. Bran muffins. Popcorn. Rye wafer crackers. Meats and other proteins Navy beans, kidney beans, and pinto beans. Soybeans. Split peas. Lentils. Nuts and seeds. Dairy Fiber-fortified yogurt. Beverages Fiber-fortified soy milk. Fiber-fortified orange juice. Other foods Fiber bars. The items listed above may not be a complete list of recommended foods and beverages. Contact a dietitian for more information. What foods should I avoid? Fruits Fruit juice. Cooked, strained fruit. Vegetables Fried potatoes. Canned vegetables. Well-cooked vegetables. Grains White bread. Pasta made with refined flour. White rice. Meats and other proteins Fatty cuts of meat. Fried chicken or fried fish. Dairy Milk. Yogurt. Cream cheese. Sour cream. Fats and oils Butters. Beverages Soft drinks. Other foods Cakes and pastries. The items listed above may not be a complete list of foods and beverages to avoid. Talk with your dietitian about what choices are best for you. Summary Fiber is a type of carbohydrate. It is found in foods such as fruits, vegetables, whole grains, and beans. A high-fiber diet has many benefits. It can help to prevent constipation, lower  blood cholesterol, aid weight loss, and reduce your risk of heart disease, diabetes, and certain cancers. Increase your intake of fiber gradually. Increasing fiber too quickly may cause cramping, bloating, and gas. Drink plenty of water while you increase the amount of fiber you consume. The best sources of fiber include whole fruits and vegetables, whole grains, nuts, seeds, and beans. This information is not intended to replace advice given to you by your health care provider. Make sure you discuss any questions you have with your health care provider. Document Revised: 09/30/2019 Document Reviewed: 09/30/2019 Elsevier Patient Education  [place].

## 2022-12-11 ENCOUNTER — Encounter: Payer: Self-pay | Admitting: Cardiology

## 2022-12-16 NOTE — Progress Notes (Deleted)
Cardiology Clinic Note   Patient Name: [name] Date of Encounter: 12/16/2022  Primary Care Provider:  Kaleen Mask, MD Primary Cardiologist:  Bryan Lemma, MD  Patient Profile    [name] 52 year old male presents the clinic today for follow-up evaluation of his NSVT.  Past Medical History    Past Medical History:  Diagnosis Date   Arthritis    Generalized anxiety disorder    GERD (gastroesophageal reflux disease)    Heart palpitations    Frequent PACs & PVC with intermittent short runs of PAT & NSVT   Morbid obesity (HCC) 02/16/2022   Nonsustained ventricular tachycardia (HCC) 10/13/2012   Noted on Monitor 5/7-6/5    Psoriasis vulgaris    Ulcerative colitis, chronic (HCC)    with duodenal ulcer   Past Surgical History:  Procedure Laterality Date   DOPPLER ECHOCARDIOGRAPHY  07/09/2012   EF 55-0%, mild LVH, otherwise normal   I & D EXTREMITY  05/01/2012   Procedure: IRRIGATION AND DEBRIDEMENT EXTREMITY;  Surgeon: Dominica Severin, MD;  Location: [facility] OR;  Service: Orthopedics;  Laterality: Left;   NERVE AND TENDON REPAIR  05/01/2012   Procedure: NERVE AND TENDON REPAIR;  Surgeon: Dominica Severin, MD;  Location: [facility] OR;  Service: Orthopedics;  Laterality: Left;    Allergies  No Known Allergies  History of Present Illness    [name] has a PMH of palpitations, NSVT, morbid obesity, and essential hypertension.  Nuclear stress test 11/21/2022 was normal.  He was noted to have mild diaphragmatic attenuation, no ischemia or infarct.  Echocardiogram 07/09/2012 showed an EF of 55-60%, mild LVH, normal diastolic parameters, normal mitral valve function.  He was seen in follow-up by Dr. Herbie Baltimore on 02/04/2022.  He was noted to have some lower extremity swelling towards the end of the day.  He also noted some weight gain.  He reported rare episodes of increased heart rate.  These were associated with dizziness on and off.  He noted once every 3 to 4 weeks and described  his sensation as a fluttering type feeling.  He denies chest pain and dyspnea.  He noted exertional dyspnea.  He had a pending sleep study.  He reported chronic ulcerative colitis flares.  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  He reported not getting a lot of sleep at night related to stress and anxiety.  He wore a cardiac event monitor 04/29/2022.  He was noted to have no sustained arrhythmias.  Rare less than 1% PACs and PVCs, 1 brief atrial run of 4 beats.  He presented to the clinic 08/01/22 for follow-up evaluation and stated he was able to tolerate metoprolol somewhat with taking half a pill at night.  We reviewed his cardiac event monitor and he expressed understanding.  His palpitations were multifactorial and were felt to be in part d/t anxiety/stress.  We reviewed the importance of weight management and increased physical activity.  I reviewed triggers for palpitations.  He expressed understanding.  I  gave him a high-fiber diet instructions, triggers for palpitations, referral to ENT for evaluation of ringing in the ears and planned follow-up in 6 months.  He presents to the clinic today for follow-up evaluation and states***.  Today he denies chest pain, shortness of breath, lower extremity edema, palpitations, melena, hematuria, hemoptysis, diaphoresis,  presyncope, and syncope.  Palpitations-stable.  Continues to note brief episodes of intermittent palpitations.  Prior cardiac event monitor 04/29/2022 showed  rare PACs and PVCs and 1 brief atrial run of 4 beats. Continue metoprolol Avoid triggers caffeine, chocolate, EtOH, dehydration etc.-reviewed  Essential hypertension-blood pressure today 138***/78.   Continue metoprolol Heart healthy low-sodium diet-salty 6 reviewed Maintain physical activity Maintain blood pressure log  Anxiety/stress-sleeping somewhat better. Continue mindfulness stress reduction Follows with PCP   Disposition:  Follow-up with Dr. Herbie Baltimore or me in 9-12 months.  Home Medications    Prior to Admission medications   Medication Sig Start Date End Date Taking? Authorizing Provider  benzonatate (TESSALON) 100 MG capsule Take 1 capsule (100 mg total) by mouth every 8 (eight) hours. 05/26/20   Renne Crigler, PA-C  busPIRone (BUSPAR) 10 MG tablet Take 1/2 tablet bid,prn    [provider]  mesalamine (LIALDA) 1.2 G EC tablet Take 1,200 mg by mouth. ,prn    [provider]  metoprolol tartrate (LOPRESSOR) 25 MG tablet Take 1 tablet (25 mg total) by mouth 2 (two) times daily. 06/20/22   Marykay Lex, MD  omeprazole (PRILOSEC OTC) 20 MG tablet Take 20 mg by mouth daily.    [provider]  Oxycodone-Acetaminophen (PERCOCET PO) Take by mouth.    [provider]  Probiotic Product (PROBIOTIC DAILY PO) Take by mouth.    [provider]  SKYRIZI 150 MG/ML SOSY Inject 1 Application into the skin every 3 (three) months. 12/10/21   [provider]    Family History    Family History  Problem Relation Age of Onset   Stroke Father    Cancer Maternal Grandmother    Cancer Maternal Grandfather    Cancer Paternal Grandmother    Stroke Paternal Grandfather    He indicated that his mother is alive. He indicated that his father is alive. He indicated that his brother is alive. He indicated that his maternal grandmother is deceased. He indicated that his maternal grandfather is deceased. He indicated that his paternal grandmother is deceased. He indicated that his paternal grandfather is deceased.  Social History    Social History   Socioeconomic History   Marital status: Married    Spouse name: Not on file   Number of children: Not on file   Years of education: Not on file   Highest education level: Not on file  Occupational History   Not on file  Tobacco Use   Smoking status: Former   Smokeless tobacco: Former    Quit date: 11/18/1986   Tobacco  comments:    smoked high school  Substance and Sexual Activity   Alcohol use: No   Drug use: Not on file   Sexual activity: Not on file  Other Topics Concern   Not on file  Social History Narrative   Not on file   Social Determinants of Health   Financial Resource Strain: Not on file  Food Insecurity: Not on file  Transportation Needs: Not on file  Physical Activity: Not on file  Stress: Not on file  Social Connections: Not on file  Intimate Partner Violence: Not on file     Review of Systems    General:  No chills, fever, night sweats or weight changes.  Cardiovascular:  No chest pain, dyspnea on exertion, edema, orthopnea, palpitations, paroxysmal nocturnal dyspnea. Dermatological: No rash, lesions/masses Respiratory: No cough, dyspnea Urologic: No hematuria, dysuria Abdominal:   No nausea, vomiting, diarrhea, bright red blood per rectum, melena, or hematemesis Neurologic:  No visual changes, wkns, changes in mental status. All other systems reviewed and are otherwise  negative except as noted above.  Physical Exam    VS:  There were no vitals taken for this visit. , BMI There is no height or weight on file to calculate BMI. GEN: Well nourished, well developed, in no acute distress. HEENT: normal. Neck: Supple, no JVD, carotid bruits, or masses. Cardiac: RRR, no murmurs, rubs, or gallops. No clubbing, cyanosis, edema.  Radials/DP/PT 2+ and equal bilaterally.  Respiratory:  Respirations regular and unlabored, clear to auscultation bilaterally. GI: Soft, nontender, nondistended, BS + x 4. MS: no deformity or atrophy. Skin: warm and dry, no rash. Neuro:  Strength and sensation are intact. Psych: Normal affect.  Accessory Clinical Findings    Recent Labs: No results found for requested labs within last 365 days.   Recent Lipid Panel No results found for: "CHOL", "TRIG", "HDL", "CHOLHDL", "VLDL", "LDLCALC", "LDLDIRECT"  No BP recorded.  {Refresh Note OR Click here  to enter BP  :1}***    ECG personally reviewed by me today-none today.  Cardiac event monitor 04/29/2022    ZioPatch Wear Time:  7 days and 4 hours (2023-11-01T18:53:08-398 to 2023-11-08T22:48:28-0500)   Predominant Underlying Rhythm: Sinus rhythm: HR range 48-130 bpm, Avg 78 bpm   Rare  (<1 %) isolated premature atrial contractions PACs and PVCs noted, with rare couplets   1 brief atrial run of 4 beats (100-119 bpm with an average 112 bpm lasting 2.3 seconds) - noted on patient monitor   Patient Triggers: Sinus Rhythm (+/- Tachycardia) with PACs, PVCs   No Sustained Arrhythmias: Atrial Tachycardia (AT), Supraventricular Tachycardia (SVT), Atrial Fibrillation (A-Fib), Atrial Flutter (A-Flutter), Sustained Ventricular Tachycardia (VT)     Relatively benign study.  Symptoms mostly noted with isolated PACs or PVCs.  1 short PAT run. No sustained arrhythmias. Would recommend expectant/nonmedical management.  Reassuring findings.       Bryan Lemma, MD  Assessment & Plan   1.  ***   Thomasene Ripple. Johnluke Haugen NP-C     12/16/2022, 10:41 AM [facility] [address] Office [phone] Fax [phone]    I spent 14*** minutes examining this patient, reviewing medications, and using patient centered shared decision making involving her cardiac care.  Prior to her visit I spent greater than 20 minutes reviewing her past medical history,  medications, and prior cardiac tests.

## 2022-12-19 ENCOUNTER — Ambulatory Visit: Payer: BLUE CROSS/BLUE SHIELD | Attending: General Practice | Admitting: General Practice

## 2023-01-13 ENCOUNTER — Encounter (INDEPENDENT_AMBULATORY_CARE_PROVIDER_SITE_OTHER): Payer: BLUE CROSS/BLUE SHIELD | Admitting: Cardiology

## 2023-01-13 DIAGNOSIS — R002 Palpitations: Secondary | ICD-10-CM

## 2023-01-13 NOTE — Telephone Encounter (Signed)
Please contact her Reum and let him know that I have reviewed his question.  I commended him on his weight loss.  His blood pressure appears to be well-controlled.  I agree that he should continue to maintain/increase his p.o. fluid intake.  I would also recommend that he avoid triggers caffeine, chocolate, EtOH etc.  He may take an extra half dose of metoprolol for sustained PVCs/palpitations.  Thank you.

## 2023-01-13 NOTE — Telephone Encounter (Signed)
Thank you for letting us know.  It is normal for resting heart rate to drop with sleep.  I would recommend 1/4 tablet 6.25 mg of metoprolol tartrate for extended palpitations.  Again, avoid triggers caffeine, chocolate, EtOH, dehydration.  Try these things before taking as needed dose of medication.

## 2023-02-07 NOTE — Telephone Encounter (Signed)
Please contact [name] and let him know that propranolol is typically prescribed 3 times daily due to its short acting properties.  For management of his palpitations I would not recommend once daily dosing.  I would recommend that he take propranolol 10 mg twice daily.  However, he may take the medication twice daily as needed for palpitations.  Thank you for your help.  Thomasene Ripple. Latoya Maulding NP-C     02/07/2023, 6:17 AM [facility] [address] Office [phone] Fax [phone]

## 2023-02-12 ENCOUNTER — Encounter: Payer: Self-pay | Admitting: Cardiology

## 2023-02-12 NOTE — Telephone Encounter (Signed)
His monitor was relatively benign.  With sinus rhythm with a heart rate range of 39-130 beats a minute and average of 70 beats a minute.  Rare isolated PACs and PVCs with some couplets and triplets he certainly felt both PACs and PVCs but also had symptoms with sinus rhythm.  He had 1 brief 7 beat run of PVCs/ventricular tachycardia that was asymptomatic, and 2 atrial runs of 4 and 5 beats that lasted roughly 2 seconds with rates ranging from 109-170 beats a minute.  He noted this lower of the 2.  Overall, relatively benign study, but probably symptomatic PACs or PVCs.  I would be reluctant to treat with the beta-blocker or another medication to the slow beats which are beats down because a relatively benign and the side effects of beta-blocker may be not worth it.  We can discuss in follow-up, but I do not think it something we should stress about at this point.   Bryan Lemma, MD

## 2023-02-18 NOTE — Telephone Encounter (Signed)
I spent greater than 10 minutes reviewing patient's past medical history and cardiac medications.  Additionally I spent 2 minutes drafting response to this patient.  Date of encounter 01/13/2023  [name]. Adilynne Fitzwater NP-C

## 2023-02-24 ENCOUNTER — Encounter: Payer: Self-pay | Admitting: Cardiology

## 2023-02-24 ENCOUNTER — Ambulatory Visit: Payer: BLUE CROSS/BLUE SHIELD | Attending: Cardiology | Admitting: Cardiology

## 2023-02-24 VITALS — BP 128/86 | HR 70 | Ht 69.0 in | Wt 263.8 lb

## 2023-02-24 DIAGNOSIS — R4589 Other symptoms and signs involving emotional state: Secondary | ICD-10-CM

## 2023-02-24 DIAGNOSIS — R002 Palpitations: Secondary | ICD-10-CM

## 2023-02-24 DIAGNOSIS — I4729 Other ventricular tachycardia: Secondary | ICD-10-CM | POA: Diagnosis not present

## 2023-02-24 MED ORDER — METOPROLOL TARTRATE 25 MG PO TABS: 12.5000 mg | ORAL_TABLET | Freq: Two times a day (BID) | ORAL | Status: DC

## 2023-02-24 MED ORDER — METOPROLOL TARTRATE 25 MG PO TABS: 12.5000 mg | ORAL_TABLET | Freq: Two times a day (BID) | ORAL | 6 refills | Status: AC

## 2023-02-24 NOTE — Progress Notes (Unsigned)
Cardiology Office Note:  .   Date:  02/26/2023  ID:  [name], DOB [birth year], MRN [id number] PCP: Lyman Bishop Harvie Junior, FNP  [facility] Providers Cardiologist:  Bryan Lemma, MD     Chief Complaint  Patient presents with   Follow-up   Palpitations    Having significantly worsening symptoms of palpitations.  Feeling them up to 8-9 times a minute.  Not really feeling his heart rate going fast.  No associated dizziness.  Making him very stressed.    History of Present Illness: .     [name] is a morbidly obese/very anxious 52 y.o. male with a PMH notable for psoriasis, Ulcerative Colitis/GERD, GAD, and Heart Palpitations (rare PACs, PVCs with short intermittent runs of PAT and NSVT) who presents here for reevaluation of palpitations at the request of Kaleen Mask, *.  I last saw [name] February 04, 2022 for palpitations.  In the past he had had a Myoview that was nonischemic.  We had him wear a monitor reviewed below.  He was reluctant to try beta-blocker.  He was seen by Edd Fabian, NP on August 01, 2022.  Noted that he was able to tolerate 1 dose of metoprolol succinate 25 mg.  Taking 12.5 mg nightly.  Noted that his palpitations are multifactorial-related to anxiety and stress.  Recommended continuing current dose of Toprol and avoid triggers.  Try to avoid stress triggers as well.  He was recently seen Lorretta Harp, MD [facility]) on August 6.  Noted having palpitation to come and go.  EKG showed PVCs.  Patient noted that he feels "every abnormal beat ".  Skipping and fluttering.  Recommended 2-week amatory monitor.  Recommended Toprol 25 mg-take nightly.    Subjective  INTERVAL HISTORY [name] presents here today extremely anxious and nervous.  He called in on September 4 indicated that he had been to [facility] to see a cardiologist had a monitor.  He mmHg that.  He was noticing significant worsening palpitation symptoms of skipping  beats.  Having them all day long. "PVCs or PACs started really bad yesterday afternoon. Anywhere from 8-12 a minute. They won't stop. I had some metoprolol tartrate 25mg  that my primary doctor wrote for blood pressure, i took one at 4pm yesterday. Didn't really do anything. 8-12 a minute all afternoon. I took another 25mg  at 10pm. I finally fell asleep around midnight, still having them. Woke up around 2am, still having them. Woke up at 5am, still having them. Been up ever since, still 8-12 a minute, sometimes 6-8 a minute. Do i take another 25mg ? It's never been this bad, in all the years I've had these. I have an appt at 2:45 today with Dr Herbie Baltimore. Please help. "  He is extremely exacerbated today.  He never really did take metoprolol that was started by Verdon Cummins or by the [facility] cardiologist.  He said he took 1 dose he just felt tired and worn out fatigue.  He did not take any more than 1 dose.  He says that he has much for this fluttering sensation but does not feel he is going fast.  He does not notice it when he is walking in fact when he is walking and his heart rate goes up he feels better.  He has been walking almost every other day 3 miles and has lost some weight.  Doing pretty well. However he is clearly going through some emotional stress which is indicative of  how much she is stuttering with ostomy.  He says that he feels every time there is a skipped beat  He is not really describing any chest pain or pressure at rest or exertion.  No dizziness.  No syncope or near-syncope.  No dyspnea at rest or exertion.  No PND orthopnea.  No edema.  ROS:   Review of Systems - Negative except significant anxiety, GI upset and fatigue.  Has extreme fear of taking medications.     Objective   Studies Reviewed: Marland Kitchen   EKG Interpretation Date/Time:  Monday February 24 2023 14:21:45 EDT Ventricular Rate:  70 PR Interval:  148 QRS Duration:  82 QT Interval:  394 QTC Calculation: 425 R Axis:   24  Text  Interpretation: Sinus rhythm with Premature atrial complexes When compared with ECG of 26-May-2020 08:02, PACs present Confirmed by Bryan Lemma ([id number]) on 02/24/2023 2:46:23 PM    7-day Zio patch monitor November 2023:   Predominant Underlying Rhythm: Sinus rhythm: HR range 48-130 bpm, Avg 78 bpm   Rare  (<1 %) isolated premature atrial contractions PACs and PVCs noted, with rare couplets   1 brief atrial run of 4 beats (100-119 bpm with an average 112 bpm lasting 2.3 seconds) - noted on patient monitor   Patient Triggers: Sinus Rhythm (+/- Tachycardia) with PACs, PVCs   No Sustained Arrhythmias: Atrial Tachycardia (AT), Supraventricular Tachycardia (SVT), Atrial Fibrillation (A-Fib), Atrial Flutter (A-Flutter), Sustained Ventricular Tachycardia (VT) MONITOR 01/28/2023 [facility]): Mostly sinus rhythm, rate range 52 to 120 bpm.  Rare ectopy with runs short run of PAT.  1 episode of NSVT-7 beats.  2 PAT runs.  Risk Assessment/Calculations:       Physical Exam:   VS:  BP 128/86   Pulse 70   Ht 5\' 9"  (1.753 m)   Wt 263 lb 12.8 oz (119.7 kg)   SpO2 99%   BMI 38.96 kg/m    Wt Readings from Last 3 Encounters:  02/24/23 263 lb 12.8 oz (119.7 kg)  08/01/22 291 lb (132 kg)  02/04/22 286 lb 3.2 oz (129.8 kg)    GEN: Well nourished, well developed in no acute distress; very anxious.  As a baseline stutter. NECK: No JVD; No carotid bruits CARDIAC: Normal S1, S2; RRR with pretty intermittent ectopy that he acknowledges each time, no murmurs, rubs, gallops RESPIRATORY:  Clear to auscultation without rales, wheezing or rhonchi ; nonlabored, good air movement. ABDOMEN: Soft, non-tender, non-distended; obese EXTREMITIES:  No edema; No deformity      ASSESSMENT AND PLAN: .    Problem List Items Addressed This Visit       Cardiology Problems   NSVT (nonsustained ventricular tachycardia) (HCC) (Chronic)    Short very short burst in the past.  1 episode of this last monitor.  Has had an  ischemic evaluation and not having any STEMI symptoms.  I think this is just due to electrolyte abnormalities and stress.  Should be off of beta-blocker.      Relevant Medications   metoprolol tartrate (LOPRESSOR) 25 MG tablet   Other Relevant Orders   EKG 12-Lead (Completed)     Other   Anxiety about health    Needs to discuss treatment options at this PCP.  Hopefully beta-blocker will help slow.      Heart palpitations - Primary (Chronic)    Compared to 2014, the monitor both in 2023 and 2024 if shown rare (less than 1%) PACs and PVCs.  Short PAT or PVC bursts also  noted but not very much notable on the symptom log.  I suspect a lot of these are driven by anxiety and stress.  Recommended he talk to his PCP about some type of SSRI or SNRI.  He does not want to take some like Xanax.  I do think would be stress and anxiety being the main driving force of the beta-blocker would be an effective treatment option.  He needs to just take the beta-blocker and allow it to work and not wear the side effects right away.  After a week or 2 he should start feeling better.  Because of the fatigue he felt with a higher 25 mg dose, we will start with Lopressor 12.5 mg twice daily and he can take an extra half tablet as needed for bad spells. Recommended adequate hydration, avoiding triggers of caffeine and sweets.  Also recommended walking whenever he feels his heart racing.  If Lopressor does not work and even low-dose, would then probably consider diltiazem prior to switching to acebutolol.      Relevant Orders   EKG 12-Lead (Completed)   Morbid obesity (HCC)    Has done well with weight loss still needs more.  Continue to diet and exercise.               Dispo: Return in about 2 months (around 04/26/2023) for Routine Follow-up after testing ~ 1-2 months.  Total time spent: 33 min spent with patient + 24 min spent charting = 57 min     Signed, Marykay Lex, MD, MS Bryan Lemma, M.D., M.S. Interventional Cardiologist  [facility]  Pager # [phone] Phone # [phone] [address] [place], [place] [address]

## 2023-02-24 NOTE — Telephone Encounter (Signed)
Patient reviewed and discussed with Dr Herbie Baltimore at appointment on 02/24/23.

## 2023-02-24 NOTE — Patient Instructions (Signed)
Medication Instructions:    Start taking  Metoprolol 12.5 mg ( 1/2 tablet of 25 mg)  twice a day , you may take  an extra 12.5 mg midday  if needed   Recommend you discuss with primary provider anxiety medications.    *If you need a refill on your cardiac medications before your next appointment, please call your pharmacy*   Lab Work:  Not needed    Testing/Procedures:    Follow-Up: At [facility], you and your health needs are our priority.  As part of our continuing mission to provide you with exceptional heart care, we have created designated Provider Care Teams.  These Care Teams include your primary Cardiologist (physician) and Advanced Practice Providers (APPs -  Physician Assistants and Nurse Practitioners) who all work together to provide you with the care you need, when you need it.  We recommend signing up for the patient portal called "MyChart".  Sign up information is provided on this After Visit Summary.  MyChart is used to connect with patients for Virtual Visits (Telemedicine).  Patients are able to view lab/test results, encounter notes, upcoming appointments, etc.  Non-urgent messages can be sent to your provider as well.   To learn more about what you can do with MyChart, go to [email or web address].    Your next appointment:   2 month(s)  The format for your next appointment:   Virtual Visit   Provider:   Bryan Lemma, MD    Other Instructions

## 2023-02-25 ENCOUNTER — Encounter: Payer: Self-pay | Admitting: Cardiology

## 2023-02-25 NOTE — Telephone Encounter (Signed)
Yeah!!!!

## 2023-02-25 NOTE — Telephone Encounter (Signed)
Spoke with Pt. He was seen yesterday and started on 12.5mg  of metoprolol.  Pt states he is taking recommended dose and took additional dose today. Worried HR is too low at night per an app on his apple watch that monitors his vitals. Pt states he has not manually recorded his BPs or pulse rate only what app provides him. BP today was 129/86 pulse 52.   Recommend pt keep BP diary for next week or two, continue consistently taking prescribed dose of metoprolol as it was just started yesterday and f/u with our office with the readings.   Asked pt if he had chance to schedule appt with PCP regarding anxiety. Pt states PCP recommended amytriptyline but pt was worried about side effects so he declined.  Pt will f/u via mychart if he has additional questions. Pt had no further questions or concerns at this time.

## 2023-02-26 ENCOUNTER — Encounter: Payer: Self-pay | Admitting: Cardiology

## 2023-02-26 DIAGNOSIS — R4589 Other symptoms and signs involving emotional state: Secondary | ICD-10-CM | POA: Insufficient documentation

## 2023-02-26 NOTE — Assessment & Plan Note (Signed)
Short very short burst in the past.  1 episode of this last monitor.  Has had an ischemic evaluation and not having any STEMI symptoms.  I think this is just due to electrolyte abnormalities and stress.  Should be off of beta-blocker.

## 2023-02-26 NOTE — Assessment & Plan Note (Signed)
Needs to discuss treatment options at this PCP.  Hopefully beta-blocker will help slow.

## 2023-02-26 NOTE — Assessment & Plan Note (Signed)
Compared to 2014, the monitor both in 2023 and 2024 if shown rare (less than 1%) PACs and PVCs.  Short PAT or PVC bursts also noted but not very much notable on the symptom log.  I suspect a lot of these are driven by anxiety and stress.  Recommended he talk to his PCP about some type of SSRI or SNRI.  He does not want to take some like Xanax.  I do think would be stress and anxiety being the main driving force of the beta-blocker would be an effective treatment option.  He needs to just take the beta-blocker and allow it to work and not wear the side effects right away.  After a week or 2 he should start feeling better.  Because of the fatigue he felt with a higher 25 mg dose, we will start with Lopressor 12.5 mg twice daily and he can take an extra half tablet as needed for bad spells. Recommended adequate hydration, avoiding triggers of caffeine and sweets.  Also recommended walking whenever he feels his heart racing.  If Lopressor does not work and even low-dose, would then probably consider diltiazem prior to switching to acebutolol.

## 2023-02-26 NOTE — Assessment & Plan Note (Signed)
Has done well with weight loss still needs more.  Continue to diet and exercise.

## 2023-02-27 ENCOUNTER — Other Ambulatory Visit: Payer: Self-pay

## 2023-02-27 ENCOUNTER — Encounter: Payer: Self-pay | Admitting: Cardiology

## 2023-02-27 ENCOUNTER — Encounter (HOSPITAL_COMMUNITY): Payer: Self-pay

## 2023-02-27 ENCOUNTER — Emergency Department (HOSPITAL_COMMUNITY)
Admission: EM | Admit: 2023-02-27 | Discharge: 2023-02-28 | Disposition: A | Discharge status: Home / Self Care | Payer: BLUE CROSS/BLUE SHIELD | Attending: Emergency Medicine | Admitting: Emergency Medicine

## 2023-02-27 ENCOUNTER — Emergency Department (HOSPITAL_COMMUNITY): Payer: BLUE CROSS/BLUE SHIELD

## 2023-02-27 DIAGNOSIS — R002 Palpitations: Secondary | ICD-10-CM | POA: Diagnosis present

## 2023-02-27 LAB — CBC
HCT: 49.1 % (ref 39.0–52.0)
Hemoglobin: 16.8 g/dL (ref 13.0–17.0)
MCH: 30.4 pg (ref 26.0–34.0)
MCHC: 34.2 g/dL (ref 30.0–36.0)
MCV: 88.9 fL (ref 80.0–100.0)
Platelets: 328 10*3/uL (ref 150–400)
RBC: 5.52 MIL/uL (ref 4.22–5.81)
RDW: 13.3 % (ref 11.5–15.5)
WBC: 9.2 10*3/uL (ref 4.0–10.5)
nRBC: 0 % (ref 0.0–0.2)

## 2023-02-27 LAB — BASIC METABOLIC PANEL
Anion gap: 13 (ref 5–15)
BUN: 15 mg/dL (ref 6–20)
CO2: 18 mmol/L — ABNORMAL LOW (ref 22–32)
Calcium: 9.3 mg/dL (ref 8.9–10.3)
Chloride: 107 mmol/L (ref 98–111)
Creatinine, Ser: 1 mg/dL (ref 0.61–1.24)
GFR, Estimated: >60 mL/min (ref >60)
Glucose, Bld: 87 mg/dL (ref 70–99)
Potassium: 3.8 mmol/L (ref 3.5–5.1)
Sodium: 138 mmol/L (ref 135–145)

## 2023-02-27 LAB — TROPONIN I (HIGH SENSITIVITY)
Troponin I (High Sensitivity): 5 ng/L (ref <18)
Troponin I (High Sensitivity): 4 ng/L (ref <18)

## 2023-02-27 LAB — MAGNESIUM: Magnesium: 2.1 mg/dL (ref 1.7–2.4)

## 2023-02-27 MED ORDER — SODIUM CHLORIDE 0.9 % IV BOLUS
1000.0000 mL | Freq: Once | INTRAVENOUS | Status: AC
  Administered 2023-02-27: 1000 mL via INTRAVENOUS

## 2023-02-27 NOTE — Telephone Encounter (Signed)
Called patient due to multiple My chart messages regarding his concerns for PAC's and "unable to walk" Worsening condition.  Advised that at this point he should go to the ED. He states he was told by the doctor that this was not urgent.  I advised based on his consistent messages and his obvious concerns and stating he is worse and again cannot walk, then he needs to go to the ED.  He states understanding.

## 2023-02-27 NOTE — Discharge Instructions (Signed)
You are seen in the emergency department for increasing frequency of your palpitations.  You had an EKG chest x-ray and blood work that did not show any significant abnormalities.  You were given some IV fluids and kept on a heart monitor for period of time without any significant abnormal rhythms.  Please continue your medications and follow-up with your cardiologist.

## 2023-02-27 NOTE — ED Provider Notes (Signed)
[place] [facility]ERGENCY DEPARTMENT AT [facility] Provider Note   CSN: [id number] Arrival date & time: 02/27/23  1656     History  No chief complaint on file.   [name] is a 52 y.o. male.  His longstanding history of palpitations PACs PVCs.  It also sounds like he has had an episode of NSVT in the past.  He has been having more palpitations than normal and they have been increasingly intrusive.  His heart doctor started him on metoprolol twice a day and can take an extra if needed.  He said they have been coming more frequently and his doctor told him if it continues to bother him he should come to the emergency department for further evaluation.  He denies any chest pain no syncope.  No shortness of breath.  No fevers or chills.  The history is provided by the patient.  Palpitations Palpitations quality:  Irregular Timing:  Intermittent Progression:  Worsening Chronicity:  Chronic Relieved by:  Nothing Worsened by:  Nothing Ineffective treatments:  Beta blockers Associated symptoms: no chest pain, no cough, no diaphoresis, no nausea and no vomiting        Home Medications Prior to Admission medications   Medication Sig Start Date End Date Taking? Authorizing Provider  metoprolol tartrate (LOPRESSOR) 25 MG tablet Take 0.5 tablets (12.5 mg total) by mouth 2 (two) times daily. May take an extra 12.5 mg midday if needed for palp. 02/24/23 05/25/23  Marykay Lex, MD  omeprazole (PRILOSEC OTC) 20 MG tablet Take 20 mg by mouth daily.    [provider]  Probiotic Product (PROBIOTIC DAILY PO) Take by mouth. Patient not taking: Reported on 02/24/2023    [provider]  SKYRIZI 150 MG/ML SOSY Inject 1 Application into the skin every 3 (three) months. 12/10/21   [provider]      Allergies    Patient has no known allergies.    Review of Systems   Review of Systems  Constitutional:  Negative for diaphoresis.  Respiratory:  Negative for  cough.   Cardiovascular:  Positive for palpitations. Negative for chest pain.  Gastrointestinal:  Negative for nausea and vomiting.    Physical Exam Updated Vital Signs BP (!) 143/89 (BP Location: Left Arm)   Pulse 72   Temp 98.3 F (36.8 C) (Oral)   Resp 17   Ht 5\' 9"  (1.753 m)   Wt 119.7 kg   SpO2 100%   BMI 38.97 kg/m  Physical Exam Vitals and nursing note reviewed.  Constitutional:      General: He is not in acute distress.    Appearance: Normal appearance. He is well-developed.  HENT:     Head: Normocephalic and atraumatic.  Eyes:     Conjunctiva/sclera: Conjunctivae normal.  Cardiovascular:     Rate and Rhythm: Normal rate and regular rhythm.     Heart sounds: No murmur heard. Pulmonary:     Effort: Pulmonary effort is normal. No respiratory distress.     Breath sounds: Normal breath sounds.  Abdominal:     Palpations: Abdomen is soft.     Tenderness: There is no abdominal tenderness. There is no guarding or rebound.  Musculoskeletal:        General: No swelling.     Cervical back: Neck supple.  Skin:    General: Skin is warm and dry.     Capillary Refill: Capillary refill takes less than 2 seconds.  Neurological:     General: No  focal deficit present.     Mental Status: He is alert.     Sensory: No sensory deficit.     Motor: No weakness.     ED Results / Procedures / Treatments   Labs (all labs ordered are listed, but only abnormal results are displayed) Labs Reviewed  BASIC METABOLIC PANEL - Abnormal; Notable for the following components:      Result Value   CO2 18 (*)    All other components within normal limits  CBC  MAGNESIUM  TROPONIN I (HIGH SENSITIVITY)  TROPONIN I (HIGH SENSITIVITY)    EKG EKG Interpretation Date/Time:  Thursday February 27 2023 17:09:36 EDT Ventricular Rate:  71 PR Interval:  146 QRS Duration:  82 QT Interval:  388 QTC Calculation: 421 R Axis:   36  Text Interpretation: Normal sinus rhythm Normal ECG When  compared with ECG of 24-Feb-2023 14:21, No significant change since last tracing Confirmed by Meridee Score [phone]) on 02/27/2023 10:15:44 PM  Radiology DG Chest 2 View  Result Date: 02/27/2023 CLINICAL DATA:  Chest pain, arrhythmia EXAM: CHEST - 2 VIEW COMPARISON:  05/25/2020 FINDINGS: Frontal and lateral views of the chest demonstrate an unremarkable cardiac silhouette. No acute airspace disease, effusion, or pneumothorax. No acute bony abnormalities. IMPRESSION: 1. No acute intrathoracic process. Electronically Signed   By: Sharlet Salina M.D.   On: 02/27/2023 19:03    Procedures Procedures    Medications Ordered in ED Medications  sodium chloride 0.9 % bolus 1,000 mL (has no administration in time range)    ED Course/ Medical Decision Making/ A&P Clinical Course as of 02/28/23 1028  Thu Feb 27, 2023  2218 EKG did not cross in epic.  Normal sinus rhythm normal intervals no acute ST-T changes. [MB]  2309 Reviewed cardiology notes from Dr. Herbie Baltimore.  He continues to recommend patient take the beta-blocker and allow it to work. [MB]    Clinical Course User Index [MB] Terrilee Files, MD                                 Medical Decision Making Amount and/or Complexity of Data Reviewed Labs: ordered. Radiology: ordered.   This patient complains of palpitations; this involves an extensive number of treatment Options and is a complaint that carries with it a high risk of complications and morbidity. The differential includes palpitations, V. tach, dehydration, anemia, metabolic derangement, ACS, anxiety  I ordered, reviewed and interpreted labs, which included CBC normal chemistries normal other than low bicarb, troponins flat I ordered medication IV fluids and reviewed PMP when indicated. I ordered imaging studies which included chest x-ray and I independently    visualized and interpreted imaging which showed no acute findings Previous records obtained and reviewed in epic  including recent cardiology notes Cardiac monitoring reviewed, sinus rhythm with no malignant rhythms Social determinants considered, no significant barriers Critical Interventions: None  After the interventions stated above, I reevaluated the patient and found patient to be well-appearing in no distress Admission and further testing considered, no indications for admission or further workup at this time.  Recommend patient continue his beta-blocker as directed by his cardiologist and follow-up with them as outpatient.  Return instructions discussed         Final Clinical Impression(s) / ED Diagnoses Final diagnoses:  Palpitations  Heart palpitations    Rx / DC Orders ED Discharge Orders     None  Terrilee Files, MD 02/28/23 [phone]

## 2023-02-27 NOTE — Telephone Encounter (Signed)
He is having PA he sees.  He does not need a loop recorder.  He needs to try to take the metoprolol and just let the medicine kick in.

## 2023-02-27 NOTE — Telephone Encounter (Signed)
Keep walking, keep working.  I it has not even been a week since we talked about trying to start the metoprolol.  Bryan Lemma, MD

## 2023-02-27 NOTE — ED Triage Notes (Signed)
Pt c/o PACS 8-10 a min constantlyx4d. Pt states when he walks the PACs ususually stop, but the past couple days they haven't stopped. Pt states the PACs are every other beat. Pt denies SOB. PT denies chest pain

## 2023-02-27 NOTE — ED Provider Triage Note (Signed)
Emergency Medicine Provider Triage Evaluation Note  SILVANO HABETZ , a 52 y.o. male  was evaluated in triage.  Pt complains of very prominent heartbeats and then pauses that he has been feeling especially when walking.  These been ongoing for a long time, and he is seen by cardiologist.  He was given metoprolol to start on this week and told these were PACs.  Comes in today because they are feel more frequent.  Denies any chest pain or shortness of breath.  Review of Systems  Positive: As above Negative: As above  Physical Exam  BP (!) 143/89 (BP Location: Left Arm)   Pulse 72   Temp 98.3 F (36.8 C) (Oral)   Resp 17   Ht 5\' 9"  (1.753 m)   Wt 119.7 kg   SpO2 100%   BMI 38.97 kg/m  Gen:   Awake, no distress   Resp:  Normal effort  MSK:   Moves extremities without difficulty    Medical Decision Making  Medically screening exam initiated at 5:18 PM.  Appropriate orders placed.  MARSHAWN CLINE was informed that the remainder of the evaluation will be completed by another provider, this initial triage assessment does not replace that evaluation, and the importance of remaining in the ED until their evaluation is complete.     Arabella Merles, PA-C 02/27/23 1719

## 2023-04-17 NOTE — Progress Notes (Unsigned)
Cardiology Telehealth Note     Virtual Visit via Video Note   Because of [name]'s co-morbid illnesses, he is at least at moderate risk for complications without adequate follow up.  This format is felt to be most appropriate for this patient at this time.  All issues noted in this document were discussed and addressed.  A limited physical exam was performed with this format.  Please refer to the patient's chart for his consent to telehealth for [facility].  Video Connection Lost Video connection was lost at < 50% of the duration of this visit, at which time the remainder of the visit was completed via audio only.    Patient has given verbal permission to conduct this visit via virtual appointment and to bill insurance 04/17/2023 5:30 PM     Evaluation Performed:  Follow-up visit  Date:  04/17/2023   ID:  [name], DOB 01-04-1971, MRN [id number]  Patient Location: Home Provider Location: Office/Clinic  Date:  04/19/2023  ID:  [name], DOB May 18, 1971, MRN [id number] PCP: Lyman Bishop, Harvie Junior, FNP  [facility] Providers Cardiologist:  Bryan Lemma, MD     No chief complaint on file.   Patient Profile: .     [name] is a morbidly obese/very anxious 52 y.o. male with a PMH notable for psoriasis, Ulcerative Colitis/GERD, GAD, and Heart Palpitations (rare PACs, PVCs with short intermittent runs of PAT and NSVT) who presents here for 26-month follow-up evaluation of PALPITATIONS at the request of Silver, Harvie Junior, FNP   I last saw [name] February 04, 2022 for palpitations.  In the past he had had a Myoview that was nonischemic.  We had him wear a monitor only rare PACs and PVCs with a brief atrial run but no sustained arrhythmias.  He was reluctant to try beta-blocker. In February 2024 he was seen by Dr. Mr. Molli Hazard, NP and at this time was taking Toprol 12.5 mg nightly.  Noted palpitations being related to anxiety and stress.  He was then seen by  [facility] in August 2024 for similar symptoms and EKG showed some PVCs he noted feeling every abnormal beat skipping and fluttering.  2-week monitor performed which basically showed similar findings to our study.  He recommended increasing Toprol to 25 mg nightly  [name] was last seen on 02/24/2023-noted that he was very anxious and nervous.  He called multiple times to the clinic discussing the visit with [facility].  He said he felt very tired and worn out taking the Toprol.  Feeling this fluttering sensation in his chest but not necessarily going fast.  Feels it all the time.  Was walking just but every day 3 miles a day.  He had lost some weight.  Lots of social stress likely attributing to palpitations. => I recommend talking with his PCP about antianxiety medications.   I also recommended using the beta-blocker but I felt that we would use a lower dose and not long-acting medication switching him to Lopressor 12.5 mg twice daily.  The other recommendation will be to get potentially consider switching to either acebutolol or diltiazem.  He was seen at [facility], ER on 02/27/2023 for episodes of palpitations.  Workup was essentially benign.  EKG relatively normal.  Again recommended that he use a beta-blocker.  Subjective  Discussed the use of AI scribe software for clinical note transcription with the patient, who gave verbal consent to proceed.  History of Present Illness   The patient, with a history of atrial tachycardia and palpitations, reports a significant episode of palpitations three days after the last consultation. The patient was advised to continue physical activity during these episodes, which reportedly helped in subsiding the symptoms after a day. The patient continues to walk every other day for about three miles and rarely experiences palpitations during these walks.  The patient was prescribed metoprolol 25mg  and diltiazem for as-needed use. However, the patient  reports feeling excessively tired or "zombified" after taking metoprolol.  The patient also reports a history of blood pressure fluctuations, with recent readings ranging from 130-133/70-75. The patient's primary care physician has emphasized the importance of maintaining blood pressure under 130. Interestingly, the patient notes a consistent difference in blood pressure readings between the left and right arms, with the left arm consistently showing higher readings. The patient also reports numbness in the left arm, from the elbow down to the hand, which has been ongoing for several months.  The patient has noticed a pattern of increased palpitations every six to seven years, which he manages by increasing physical activity and losing weight. Recently, the patient has lost about 40 pounds, which he believes may have contributed to the improvement in palpitations.      Cardiovascular ROS: no chest pain or dyspnea on exertion positive for - irregular heartbeat, palpitations, rapid heart rate, and however of late since the most recent ER visit, these have improved.  Not using medications.  Trying to get up and walk 1 daycare. negative for - edema, orthopnea, paroxysmal nocturnal dyspnea, shortness of breath, or syncope or near significant TIA or amaurosis fugax, claudication  ROS:  Review of Systems - Negative except symptoms noted above.  Lots of social stress.    Objective  Current Meds  Medication Sig   diltiazem (CARDIZEM CD) 180 MG 24 hr capsule Take 180 mg by mouth daily.   diltiazem (CARDIZEM) 60 MG tablet Take 60 mg by mouth as needed.   omeprazole (PRILOSEC OTC) 20 MG tablet Take 20 mg by mouth daily.   SKYRIZI 150 MG/ML SOSY Inject 1 Application into the skin every 3 (three) months.   Studies Reviewed: Marland Kitchen       No new studies  Risk Assessment/Calculations:             Physical Exam:   VS:  BP 130/75 (BP Location: Left Arm)   Pulse 67   Ht 5\' 10"  (1.778 m)   Wt 260 lb (117.9  kg)   BMI 37.31 kg/m    Wt Readings from Last 3 Encounters:  04/18/23 260 lb (117.9 kg)  02/27/23 263 lb 14.3 oz (119.7 kg)  02/24/23 263 lb 12.8 oz (119.7 kg)    GEN: Well nourished, well developed in no acute distress; less anxious; stuttering speech. RESPIRATORY:  nonlabored     ASSESSMENT AND PLAN: .    Problem List Items Addressed This Visit       Cardiology Problems   Atrial tachycardia (HCC) (Chronic)    Infrequent episodes of palpitations, not associated with chest pain, shortness of breath, or syncope. Episodes are self-limited and improve with physical activity. No dangerous arrhythmias noted on prior monitoring. Discussed the use of PRN metoprolol 12.5mg  or Diltiazem 60mg  as needed for symptomatic episodes. -Consider using either metoprolol 12.5mg  or Diltiazem 60mg  as needed for symptomatic episodes.  Also discussed the possibility of using the diltiazem XT 180 mg daily for BP if his blood pressures continue  to stay elevated.      Relevant Medications   diltiazem (CARDIZEM CD) 180 MG 24 hr capsule   diltiazem (CARDIZEM) 60 MG tablet   possible Stenosis of right subclavian artery (HCC)    Differential Blood Pressure in Arms -> consider subclavian stenosis versus thoracic outlet syndrome. Noted higher blood pressure readings in the left arm compared to the right, with associated numbness in the left arm. Discussed the possibility of subclavian stenosis or thoracic outlet syndrome. -Order upper extremity Doppler studies to evaluate for subclavian stenosis or thoracic outlet syndrome. -Follow-up in 3-4 months if Doppler studies are normal, sooner if abnormal.       Relevant Medications   diltiazem (CARDIZEM CD) 180 MG 24 hr capsule   diltiazem (CARDIZEM) 60 MG tablet   Other Relevant Orders   VAS [place] UPPER EXTREMITY ARTERIAL DUPLEX     Other   Borderline systolic hypertension (Chronic)    Borderline elevated blood pressure readings, with higher readings noted on the  left arm. Discussed the potential need for antihypertensive therapy if blood pressure remains consistently above 135/80, with Diltiazem as a potential option. -Monitor blood pressure in both arms. -Consider initiation of Diltiazem if blood pressure remains consistently above 135/80.      Relevant Orders   VAS [place] UPPER EXTREMITY ARTERIAL DUPLEX   Heart palpitations - Primary (Chronic)    PACs and PVCs as well as short PAT runs.  Did not tolerate longstanding beta-blocker.  Has not really used PRN beta-blocker.  Was given a prescription for PRN diltiazem by the [facility] cardiologist as well as a long-acting diltiazem dose.  Has not used either.  Episodes are self-limited and improve with physical activity. No dangerous arrhythmias noted on prior monitoring. Discussed the use of PRN metoprolol 12.5mg  or Diltiazem 60mg  as needed for symptomatic episodes. -Consider using either metoprolol 12.5mg  or Diltiazem 60mg  as needed for symptomatic episodes.  Also discussed the possibility of using the diltiazem XT 180 mg daily for BP if his blood pressures continue to stay elevated      Morbid obesity (HCC) (Chronic)    Has had some weight loss.  Continue to encourage diet and exercise changes.  Improved weight loss will also improve symptoms.  Needs to maintain adequate hydration.            Follow-Up: Return in about 3 months (around 07/19/2023) for 3-4 month follow-up.  Total time spent: 25 min spent with patient + 12 min spent charting = 37 min     Signed, Marykay Lex, MD, MS Bryan Lemma, M.D., M.S. Interventional Cardiologist  [facility]  Pager # [phone] Phone # [phone] [address] [place], [place] [address]

## 2023-04-18 ENCOUNTER — Encounter: Payer: Self-pay | Admitting: Cardiology

## 2023-04-18 ENCOUNTER — Ambulatory Visit: Payer: BLUE CROSS/BLUE SHIELD | Attending: Cardiovascular Disease | Admitting: Cardiology

## 2023-04-18 ENCOUNTER — Telehealth: Payer: Self-pay

## 2023-04-18 VITALS — BP 130/75 | HR 67 | Ht 70.0 in | Wt 260.0 lb

## 2023-04-18 DIAGNOSIS — I771 Stricture of artery: Secondary | ICD-10-CM | POA: Insufficient documentation

## 2023-04-18 DIAGNOSIS — R030 Elevated blood-pressure reading, without diagnosis of hypertension: Secondary | ICD-10-CM | POA: Insufficient documentation

## 2023-04-18 DIAGNOSIS — R002 Palpitations: Secondary | ICD-10-CM

## 2023-04-18 DIAGNOSIS — I4719 Other supraventricular tachycardia: Secondary | ICD-10-CM | POA: Diagnosis not present

## 2023-04-18 NOTE — Telephone Encounter (Signed)
Called patient   Call went straight to voicemail

## 2023-04-18 NOTE — Telephone Encounter (Signed)
  Patient Consent for Virtual Visit        [name] has provided verbal consent on 04/18/2023 for a virtual visit (video or telephone).   CONSENT FOR VIRTUAL VISIT FOR:  [name]  By participating in this virtual visit I agree to the following:  I hereby voluntarily request, consent and authorize [facility] and its employed or contracted physicians, physician assistants, nurse practitioners or other licensed health care professionals (the Practitioner), to provide me with telemedicine health care services (the "Services") as deemed necessary by the treating Practitioner. I acknowledge and consent to receive the Services by the Practitioner via telemedicine. I understand that the telemedicine visit will involve communicating with the Practitioner through live audiovisual communication technology and the disclosure of certain medical information by electronic transmission. I acknowledge that I have been given the opportunity to request an in-person assessment or other available alternative prior to the telemedicine visit and am voluntarily participating in the telemedicine visit.  I understand that I have the right to withhold or withdraw my consent to the use of telemedicine in the course of my care at any time, without affecting my right to future care or treatment, and that the Practitioner or I may terminate the telemedicine visit at any time. I understand that I have the right to inspect all information obtained and/or recorded in the course of the telemedicine visit and may receive copies of available information for a reasonable fee.  I understand that some of the potential risks of receiving the Services via telemedicine include:  Delay or interruption in medical evaluation due to technological equipment failure or disruption; Information transmitted may not be sufficient (e.g. poor resolution of images) to allow for appropriate medical decision making by the Practitioner;  and/or  In rare instances, security protocols could fail, causing a breach of personal health information.  Furthermore, I acknowledge that it is my responsibility to provide information about my medical history, conditions and care that is complete and accurate to the best of my ability. I acknowledge that Practitioner's advice, recommendations, and/or decision may be based on factors not within their control, such as incomplete or inaccurate data provided by me or distortions of diagnostic images or specimens that may result from electronic transmissions. I understand that the practice of medicine is not an exact science and that Practitioner makes no warranties or guarantees regarding treatment outcomes. I acknowledge that a copy of this consent can be made available to me via my patient portal [facility] MyChart), or I can request a printed copy by calling the office of [facility].    I understand that my insurance will be billed for this visit.   I have read or had this consent read to me. I understand the contents of this consent, which adequately explains the benefits and risks of the Services being provided via telemedicine.  I have been provided ample opportunity to ask questions regarding this consent and the Services and have had my questions answered to my satisfaction. I give my informed consent for the services to be provided through the use of telemedicine in my medical care

## 2023-04-18 NOTE — Telephone Encounter (Signed)
CALLED NO ANSWER 3RD ATTEMPT

## 2023-04-18 NOTE — Patient Instructions (Addendum)
Medication Instructions:     Recommendation if you have an episode as discuss during visit  First option is th use Metoprolol Tartrate 12.5 mg at least twice that day for few days. ( Space it out 6 to 12 hours). If this medication makes you fell overly tired , fatigue and if episodes return then try the second option which would be to try taking the Diltiazem 60 mg ( medication prescribed by  primary )      *If you need a refill on your cardiac medications before your next appointment, please call your pharmacy*   Lab Work:  Not needed   Testing/Procedures: Will be schedule  at [address] Your physician has requested that you have a upper extremity arterial duplex. This test is an ultrasound of the arteries in the arms. It looks at arterial blood flow in the  arms. Allow one hour for  Upper Arterial scans. There are no restrictions or special instructions.  Please note: We ask at that you not bring children with you during ultrasound (echo/ vascular) testing. Due to room size and safety concerns, children are not allowed in the ultrasound rooms during exams. Our front office staff cannot provide observation of children in our lobby area while testing is being conducted. An adult accompanying a patient to their appointment will only be allowed in the ultrasound room at the discretion of the ultrasound technician under special circumstances. We apologize for any inconvenience.  ( If test  is abnormal  you will have an follow up with Dr Kirke Corin at the [facility] office)   Follow-Up: At [facility], you and your health needs are our priority.  As part of our continuing mission to provide you with exceptional heart care, we have created designated Provider Care Teams.  These Care Teams include your primary Cardiologist (physician) and Advanced Practice Providers (APPs -  Physician Assistants and Nurse Practitioners) who all work together to provide you with the care you  need, when you need it.     Your next appointment:   3 month(s)  The format for your next appointment:   In Person  Provider:   Bryan Lemma, MD    Other Instructions   Continue to monitor your Blood Pressure  - you may check both arms - if consistently stays above 180 ( systolic). Dr Herbie Baltimore suggest  taking Diltiazem 180 mg  at your bedtime. Please contact office  prior to starting medication.

## 2023-04-19 ENCOUNTER — Encounter: Payer: Self-pay | Admitting: Cardiology

## 2023-04-19 NOTE — Assessment & Plan Note (Signed)
Borderline elevated blood pressure readings, with higher readings noted on the left arm. Discussed the potential need for antihypertensive therapy if blood pressure remains consistently above 135/80, with Diltiazem as a potential option. -Monitor blood pressure in both arms. -Consider initiation of Diltiazem if blood pressure remains consistently above 135/80.

## 2023-04-19 NOTE — Assessment & Plan Note (Signed)
Differential Blood Pressure in Arms -> consider subclavian stenosis versus thoracic outlet syndrome. Noted higher blood pressure readings in the left arm compared to the right, with associated numbness in the left arm. Discussed the possibility of subclavian stenosis or thoracic outlet syndrome. -Order upper extremity Doppler studies to evaluate for subclavian stenosis or thoracic outlet syndrome. -Follow-up in 3-4 months if Doppler studies are normal, sooner if abnormal.

## 2023-04-19 NOTE — Assessment & Plan Note (Signed)
Infrequent episodes of palpitations, not associated with chest pain, shortness of breath, or syncope. Episodes are self-limited and improve with physical activity. No dangerous arrhythmias noted on prior monitoring. Discussed the use of PRN metoprolol 12.5mg  or Diltiazem 60mg  as needed for symptomatic episodes. -Consider using either metoprolol 12.5mg  or Diltiazem 60mg  as needed for symptomatic episodes.  Also discussed the possibility of using the diltiazem XT 180 mg daily for BP if his blood pressures continue to stay elevated.

## 2023-04-19 NOTE — Assessment & Plan Note (Signed)
Has had some weight loss.  Continue to encourage diet and exercise changes.  Improved weight loss will also improve symptoms.  Needs to maintain adequate hydration.

## 2023-04-19 NOTE — Assessment & Plan Note (Signed)
PACs and PVCs as well as short PAT runs.  Did not tolerate longstanding beta-blocker.  Has not really used PRN beta-blocker.  Was given a prescription for PRN diltiazem by the [facility] cardiologist as well as a long-acting diltiazem dose.  Has not used either.  Episodes are self-limited and improve with physical activity. No dangerous arrhythmias noted on prior monitoring. Discussed the use of PRN metoprolol 12.5mg  or Diltiazem 60mg  as needed for symptomatic episodes. -Consider using either metoprolol 12.5mg  or Diltiazem 60mg  as needed for symptomatic episodes.  Also discussed the possibility of using the diltiazem XT 180 mg daily for BP if his blood pressures continue to stay elevated

## 2023-05-06 ENCOUNTER — Ambulatory Visit (HOSPITAL_COMMUNITY)
Admission: RE | Admit: 2023-05-06 | Discharge: 2023-05-06 | Disposition: A | Discharge status: Home / Self Care | Payer: BLUE CROSS/BLUE SHIELD | Source: Ambulatory Visit | Attending: Cardiology | Admitting: Cardiology

## 2023-05-06 DIAGNOSIS — R030 Elevated blood-pressure reading, without diagnosis of hypertension: Secondary | ICD-10-CM

## 2023-05-06 DIAGNOSIS — I771 Stricture of artery: Secondary | ICD-10-CM | POA: Diagnosis not present

## 2023-07-22 ENCOUNTER — Ambulatory Visit: Payer: BLUE CROSS/BLUE SHIELD | Attending: Cardiology | Admitting: Cardiology

## 2023-07-22 DIAGNOSIS — I10 Essential (primary) hypertension: Secondary | ICD-10-CM

## 2023-07-22 DIAGNOSIS — I4719 Other supraventricular tachycardia: Secondary | ICD-10-CM

## 2023-07-22 DIAGNOSIS — I4729 Other ventricular tachycardia: Secondary | ICD-10-CM

## 2023-07-22 DIAGNOSIS — R002 Palpitations: Secondary | ICD-10-CM

## 2023-08-07 ENCOUNTER — Encounter: Payer: Self-pay | Admitting: Cardiology

## 2023-08-08 NOTE — Telephone Encounter (Signed)
 May be hard for him to get into see me but he can come in to see the APP.

## 2023-10-20 ENCOUNTER — Encounter: Payer: Self-pay | Admitting: Cardiology

## 2023-10-21 NOTE — Telephone Encounter (Signed)
 You have had these symptoms while wearing a monitor and we did not see anything other than premature beats that were relatively rare.  I do not think this to be something that I would recommend putting a loop recorder in to assess.  PACs and PVCs are inherently benign.  You can consider [employer]-Mobile app and monitor to assess your palpitations at home so we could actually get them as they come, but I do not think a loop recorder would be warranted.  Randene Bustard, MD

## 2023-10-23 NOTE — Telephone Encounter (Signed)
 PACs do not adversely affect the heart function unlike PVCs. The way we treat these is with the medicines we have tried to use that you have not tolerated.  The beta-blockers and other meds we have tried to use are what would be to use to control these.  If you are having a bad day take an extra dose of metoprolol .  Randene Bustard, MD

## 2023-12-08 ENCOUNTER — Telehealth: Payer: Self-pay | Admitting: *Deleted

## 2023-12-08 NOTE — Telephone Encounter (Signed)
 Scheduled appointment with patient

## 2023-12-11 ENCOUNTER — Encounter: Payer: Self-pay | Admitting: Cardiology

## 2023-12-11 ENCOUNTER — Ambulatory Visit: Payer: Self-pay | Attending: Cardiology | Admitting: Cardiology

## 2023-12-11 DIAGNOSIS — I4729 Other ventricular tachycardia: Secondary | ICD-10-CM | POA: Diagnosis not present

## 2023-12-11 DIAGNOSIS — R002 Palpitations: Secondary | ICD-10-CM

## 2023-12-11 DIAGNOSIS — Z9289 Personal history of other medical treatment: Secondary | ICD-10-CM | POA: Diagnosis not present

## 2023-12-11 DIAGNOSIS — I4719 Other supraventricular tachycardia: Secondary | ICD-10-CM | POA: Diagnosis not present

## 2023-12-11 NOTE — Patient Instructions (Signed)
 Medication Instructions:   Your physician recommends that you continue on your current medications as directed. Please refer to the Current Medication list given to you today.   *If you need a refill on your cardiac medications before your next appointment, please call your pharmacy*  Lab Work:  No labs ordered today   If you have labs (blood work) drawn today and your tests are completely normal, you will receive your results only by: MyChart Message (if you have MyChart) OR A paper copy in the mail If you have any lab test that is abnormal or we need to change your treatment, we will call you to review the results.  Testing/Procedures:  No test ordered today   Follow-Up: At [facility], you and your health needs are our priority.  As part of our continuing mission to provide you with exceptional heart care, our providers are all part of one team.  This team includes your primary Cardiologist (physician) and Advanced Practice Providers or APPs (Physician Assistants and Nurse Practitioners) who all work together to provide you with the care you need, when you need it.  Your next appointment:   12 month(s)  Provider:   You may see Alm Clay, MD or one of the following Advanced Practice Providers on your designated Care Team:   Lonni Meager, NP Lesley Maffucci, PA-C Bernardino Bring, PA-C Cadence Valentine, PA-C Tylene Lunch, NP Barnie Hila, NP

## 2023-12-11 NOTE — Progress Notes (Signed)
 Cardiology Office Note:  .   Date:  12/13/2023  ID:  [name], DOB 1970/11/26, MRN [id number] PCP: Felicie Powell Chol, FNP  [facility] Providers Cardiologist:  Alm Clay, MD     Chief Complaint  Patient presents with   Follow-up    Wants to go over echo results    Patient Profile: .     [name] is a morbidly obese 53 y.o. male  with a PMH notable for psoriasis, UC, GERD and significant GAD who presents here for reevaluation of palpitations at the request of Silver, Powell Chol, *.      [name] was last seen on April 18, 2023 by telehealth again to discuss his palpitations.  He felt bona fide taking metoprolol  I did not use PRN diltiazem.  Because of discrepant blood pressure recordings on both arms we checked upper extremity arterial Dopplers that were normal.    He has also been seen by Dr. Rayetta Sor from [facility]-first visit was August 2024.  They also did a ambulatory monitor on him showing PACs and PVCs with symptoms noted with PACs.  1 short run of SVT/PAT.  Since recommendation was to trial run of diltiazem instead of metoprolol  because of metoprolol  making him feel fatigued.  Apparently tried diltiazem and felt poorly.  Was not taking any medication.  He eventually ended up having another monitor and echocardiogram ordered with results reviewed below.  He comes here today to discuss these results despite the fact they were ordered by different present provider.  Subjective  [name] insisted on being seen here today to review the results of his studies done by the [facility] cardiology team despite the fact that I do not have access to the actual studies.  He does have questions he wanted to answer/explanations of findings.  He was very concerned about the suggestion of elevated right atrial pressures although he denies any significant edema symptoms.  He still has not taken any medications for his palpitations either the diltiazem nor the metoprolol   citing side effects.  He says he still has a palpitations and was concerned about the atrial runs and ventricular runs.  Interestingly, he mostly posted buttons for PACs with 2 of the 4 beat PVC runs but none of the 3 atrial runs.  Other than palpitations, he is not having any prolonged fast heart rate spells he just feels his heart beating irregular and it makes him very anxious.  He denies any lightheadedness or dizziness associate with it.  No dyspnea.  No chest pain or pressure.  Cardiovascular ROS: no chest pain or dyspnea on exertion positive for - irregular heartbeat, palpitations, and he feels his heart beating-it feels almost every beat  negative for - edema, orthopnea, paroxysmal nocturnal dyspnea, rapid heart rate, shortness of breath, or syncope or near syncope, TIA or amaurosis fugax, claudication  ROS:  Review of Systems - Negative except significant anxiety.  UC seems stable.    Objective   Only medication is Skyrizi with PRN Prilosec 20 mg daily  Studies Reviewed: SABRA   EKG Interpretation Date/Time:  Thursday December 11 2023 08:46:40 EDT Ventricular Rate:  67 PR Interval:  152 QRS Duration:  82 QT Interval:  380 QTC Calculation: 401 R Axis:   17  Text Interpretation: Normal sinus rhythm Normal ECG When compared with ECG of 27-Feb-2023 17:09, No significant change was found Confirmed by Clay Alm ([id number]) on 12/11/2023 9:05:58 AM    ECHO [facility]): Normal  LV size and function.  EF> 55%.  Normal RV.  Mildly elevated RAP estimated 5-10 mmHg.  (12/02/2023)  MONITOR [facility]): Predominant sinus rhythm rate of 43-203 bpm and average 70 bpm.  2 ventricular runs of 4 beats (up to 203 bpm)-longest episode 1.9 seconds.  3 atrial runs lasting up to 12 seconds.  No sustained episodes and not triggered. Patient's symptoms correspond primarily to rare PACs and PVCs as well as a 4 beat run of PVCs.  More frequent PACs and PVCs. No sustained arrhythmias.   Risk  Assessment/Calculations:          Physical Exam:   VS:  BP 134/74   Pulse 67   Ht 5' 11 (1.803 m)   Wt 271 lb 3.2 oz (123 kg)   SpO2 99%   BMI 37.82 kg/m    Wt Readings from Last 3 Encounters:  12/11/23 271 lb 3.2 oz (123 kg)  04/18/23 260 lb (117.9 kg)  02/27/23 263 lb 14.3 oz (119.7 kg)    GEN: Well nourished, well groomed;  in no acute distress, but significantly anxious; moderately obese; speaks with better NECK: No JVD; No carotid bruits CARDIAC: Normal S1, S2; RRR, no murmurs, rubs, gallops RESPIRATORY:  Clear to auscultation without rales, wheezing or rhonchi ; nonlabored, good air movement. ABDOMEN: Soft, non-tender, non-distended; obese     ASSESSMENT AND PLAN: .    Problem List Items Addressed This Visit       Cardiology Problems   Atrial tachycardia (HCC) (Chronic)   Mostly PACs with very brief runs of PAT lasting maybe 12 seconds.  Interestingly he did not indicate symptoms from the atrial runs.  Provided reassurance that these are benign findings He would like to avoid medications which is reasonable.  We discussed vagal maneuvers for prolonged episodes.      NSVT (nonsustained ventricular tachycardia) (HCC) (Chronic)   Previous monitor showed 1 4 beat run of PVCs, Current monitor showed 4 4 beat runs.  Echocardiogram relatively normal and no angina or heart failure symptoms. Relatively benign findings. Provided reassurance.  No longer on beta-blocker or calcium channel blocker.        Other   H/O echocardiogram   I reviewed the report of the echocardiogram with him.  For the most part everything was basically normal with the exception of the suggestion of mildly elevated RAP.  I discussed with the remainder the findings and how this single finding would not necessarily by itself mean anything significant especially in the absence of notable edema on exam.  Lack of evidence of pulmonary hypertension would argue that there is no significant finding  here.  Provided reassurance, but did indicate the need for weight loss as obesity could lead to elevated pulmonary pressures and therefore worsening right atrial pressures.SABRA      Heart palpitations (Chronic)   Symptomatic PACs on monitor for the most part. Provided reassurance Discussed triggers and how to avoid them.  Maintain adequate hydration.      Relevant Orders   EKG 12-Lead (Completed)   Morbid obesity (HCC) - Primary (Chronic)   Unfortunately, he backtracked after weight loss.  Discussed the importance of adequate nutrition.  Weight loss will help his cardiovascular risk.               Follow-Up: Return in about 1 year (around 12/10/2024), or if symptoms worsen or fail to improve, for Followup when necessary.     Signed, Alm MICAEL Clay, MD, MS Alm Clay, M.D., M.S.  Interventional Chartered certified accountant  Pager # [phone]

## 2023-12-13 ENCOUNTER — Encounter: Payer: Self-pay | Admitting: Cardiology

## 2023-12-13 DIAGNOSIS — Z9289 Personal history of other medical treatment: Secondary | ICD-10-CM | POA: Insufficient documentation

## 2023-12-13 NOTE — Assessment & Plan Note (Signed)
 Symptomatic PACs on monitor for the most part. Provided reassurance Discussed triggers and how to avoid them.  Maintain adequate hydration.

## 2023-12-13 NOTE — Assessment & Plan Note (Signed)
 I reviewed the report of the echocardiogram with him.  For the most part everything was basically normal with the exception of the suggestion of mildly elevated RAP.  I discussed with the remainder the findings and how this single finding would not necessarily by itself mean anything significant especially in the absence of notable edema on exam.  Lack of evidence of pulmonary hypertension would argue that there is no significant finding here.  Provided reassurance, but did indicate the need for weight loss as obesity could lead to elevated pulmonary pressures and therefore worsening right atrial pressures.SABRA

## 2023-12-13 NOTE — Assessment & Plan Note (Signed)
 Mostly PACs with very brief runs of PAT lasting maybe 12 seconds.  Interestingly he did not indicate symptoms from the atrial runs.  Provided reassurance that these are benign findings He would like to avoid medications which is reasonable.  We discussed vagal maneuvers for prolonged episodes.

## 2023-12-13 NOTE — Assessment & Plan Note (Signed)
 Unfortunately, he backtracked after weight loss.  Discussed the importance of adequate nutrition.  Weight loss will help his cardiovascular risk.

## 2023-12-13 NOTE — Assessment & Plan Note (Signed)
 Previous monitor showed 1 4 beat run of PVCs, Current monitor showed 4 4 beat runs.  Echocardiogram relatively normal and no angina or heart failure symptoms. Relatively benign findings. Provided reassurance.  No longer on beta-blocker or calcium channel blocker.

## 2023-12-22 NOTE — Telephone Encounter (Signed)
 It is quite likely that his symptoms are related to his abdominal pain and fatigue.  He does not need to have a visit to see me to talk about this.  We want to know maybe differently that we already talked about.  PAC bigeminy runs are not anything to be concerned about.  They feel funny but they are not dangerous.  Probably driven by GI upset.  Possible that it is throwing off her electrolytes a little bit.  Make sure that you are eating and hydrating well.  He does not need another clinic visit.    Alm Clay, MD

## 2024-01-02 NOTE — Telephone Encounter (Signed)
 All I will say is if he wants to order a monitor and pay for himself that is fine.  We can order it for palpitations.  Regardless of what it shows, if he is not willing to take any medications to treat it then it does make any difference what we find.  He has been reluctant to take either beta-blocker or calcium channel blockers in the past.  He needs to be reassured that these are benign episodes are not causing him any damage and he needs to just get up and walk around and let them go away.  Alm Clay, MD

## 2024-01-22 ENCOUNTER — Ambulatory Visit: Attending: Cardiology

## 2024-01-22 ENCOUNTER — Other Ambulatory Visit: Payer: Self-pay | Admitting: *Deleted

## 2024-01-22 DIAGNOSIS — R002 Palpitations: Secondary | ICD-10-CM

## 2024-02-27 ENCOUNTER — Encounter: Payer: Self-pay | Admitting: Cardiology

## 2024-02-27 ENCOUNTER — Telehealth: Payer: Self-pay | Admitting: Emergency Medicine

## 2024-02-27 NOTE — Telephone Encounter (Signed)
 Called and spoke with the patient.  Patient states that the tightness he feels, he feels it more in the abdominal area and not the chest area.  Patient denies any other symptoms like lightheadedness, dizziness, or shortness of breath.  Patient denies the chest tightness travelling anywhere, rather localized only to the area below the sternum.  Patient did state that he felt this episode while he was working outside in the heat.  Patient was advised to take breaks often from the heat, stay well hydrated, and call 911 or go to the emergency room for any worsening symptoms of chest pain, or tightness, especially if it travelling to the neck, jaw or arms; or if he develops any shortness of breath, lightheadedness, or dizziness.  Patient verbalized understanding, will all questions and concerns addressed at this time.  Patient expressed gratitude for the phone call.

## 2024-03-25 ENCOUNTER — Ambulatory Visit: Payer: Self-pay | Admitting: Cardiology

## 2024-03-25 DIAGNOSIS — R002 Palpitations: Secondary | ICD-10-CM | POA: Diagnosis not present

## 2024-03-26 NOTE — Progress Notes (Signed)
 Last read by [name] at 8:30PM on 03/25/2024.

## 2024-04-08 NOTE — Telephone Encounter (Signed)
 These findings are the same findings he has had on several different studies including EKGs and monitors.  He has PACs and PVCs with short bursts of atrial tachycardia.  We have talked about what these are and how to manage them every single time I have seen him. The most important thing would be to do what he is calls, and is to reassure the patient that these are benign findings.  We talked about how to treat them and he chooses what medicines to take.  Most of these benign findings are what is triggering and provoking his anxiety.  He needs to relax and calm down when his anxiety level goes down the symptoms improve.   Alm Clay, MD

## 2024-06-07 ENCOUNTER — Encounter: Payer: Self-pay | Admitting: Cardiology
